# Patient Record
Sex: Male | Born: 1955 | Race: White | Hispanic: No | Marital: Single | Smoking: Former smoker
Health system: Southern US, Community
[De-identification: ages and names within clinical notes are randomized; demographics above are authoritative.]

## PROBLEM LIST (undated history)

## (undated) DIAGNOSIS — I10 Essential (primary) hypertension: Secondary | ICD-10-CM

## (undated) DIAGNOSIS — M1990 Unspecified osteoarthritis, unspecified site: Secondary | ICD-10-CM

## (undated) DIAGNOSIS — M869 Osteomyelitis, unspecified: Secondary | ICD-10-CM

## (undated) DIAGNOSIS — F319 Bipolar disorder, unspecified: Secondary | ICD-10-CM

## (undated) HISTORY — PX: HERNIA REPAIR: SHX51

## (undated) HISTORY — PX: FOOT SURGERY: SHX648

## (undated) HISTORY — PX: HAND SURGERY: SHX662

---

## 2003-11-23 ENCOUNTER — Other Ambulatory Visit: Payer: Self-pay

## 2003-11-24 ENCOUNTER — Other Ambulatory Visit: Payer: Self-pay

## 2004-05-13 ENCOUNTER — Emergency Department (HOSPITAL_COMMUNITY): Admission: EM | Admit: 2004-05-13 | Discharge: 2004-05-13 | Payer: Self-pay | Admitting: Emergency Medicine

## 2004-09-23 ENCOUNTER — Emergency Department: Payer: Self-pay | Admitting: Emergency Medicine

## 2007-01-11 ENCOUNTER — Emergency Department: Payer: Self-pay | Admitting: Emergency Medicine

## 2007-07-23 ENCOUNTER — Ambulatory Visit: Payer: Self-pay | Admitting: Gastroenterology

## 2008-10-28 ENCOUNTER — Emergency Department: Payer: Self-pay | Admitting: Emergency Medicine

## 2008-11-05 ENCOUNTER — Emergency Department: Payer: Self-pay | Admitting: Emergency Medicine

## 2008-12-04 ENCOUNTER — Emergency Department: Payer: Self-pay | Admitting: Emergency Medicine

## 2009-05-10 ENCOUNTER — Emergency Department: Payer: Self-pay | Admitting: Emergency Medicine

## 2009-05-14 ENCOUNTER — Emergency Department: Payer: Self-pay | Admitting: Emergency Medicine

## 2009-10-28 ENCOUNTER — Emergency Department: Payer: Self-pay | Admitting: Emergency Medicine

## 2010-04-02 ENCOUNTER — Ambulatory Visit: Payer: Self-pay | Admitting: Gastroenterology

## 2012-03-20 ENCOUNTER — Emergency Department: Payer: Self-pay | Admitting: Emergency Medicine

## 2012-03-20 LAB — URINALYSIS, COMPLETE
Bilirubin,UR: NEGATIVE
Glucose,UR: NEGATIVE mg/dL (ref 0–75)
Nitrite: NEGATIVE
Ph: 6 (ref 4.5–8.0)
Protein: 30
Specific Gravity: 1.023 (ref 1.003–1.030)

## 2012-03-23 LAB — URINE CULTURE

## 2013-01-26 ENCOUNTER — Emergency Department: Payer: Self-pay | Admitting: Emergency Medicine

## 2013-01-26 LAB — CBC WITH DIFFERENTIAL/PLATELET
Basophil #: 0 10*3/uL (ref 0.0–0.1)
Eosinophil %: 4.5 %
HCT: 43.4 % (ref 40.0–52.0)
Lymphocyte #: 2.6 10*3/uL (ref 1.0–3.6)
Lymphocyte %: 25.6 %
MCH: 28 pg (ref 26.0–34.0)
Monocyte #: 1.2 x10 3/mm — ABNORMAL HIGH (ref 0.2–1.0)
Neutrophil %: 57.4 %
RDW: 13.7 % (ref 11.5–14.5)

## 2013-01-26 LAB — BASIC METABOLIC PANEL
BUN: 15 mg/dL (ref 7–18)
Calcium, Total: 8.9 mg/dL (ref 8.5–10.1)
Co2: 29 mmol/L (ref 21–32)
Creatinine: 1.26 mg/dL (ref 0.60–1.30)
Osmolality: 275 (ref 275–301)
Sodium: 136 mmol/L (ref 136–145)

## 2013-01-26 LAB — URIC ACID: Uric Acid: 7.4 mg/dL — ABNORMAL HIGH (ref 3.5–7.2)

## 2013-01-26 LAB — SEDIMENTATION RATE: Erythrocyte Sed Rate: 1 mm/hr (ref 0–20)

## 2013-01-29 ENCOUNTER — Inpatient Hospital Stay: Payer: Self-pay | Admitting: Internal Medicine

## 2013-01-29 LAB — COMPREHENSIVE METABOLIC PANEL
Albumin: 3.5 g/dL (ref 3.4–5.0)
Alkaline Phosphatase: 138 U/L — ABNORMAL HIGH (ref 50–136)
Calcium, Total: 8.7 mg/dL (ref 8.5–10.1)
Co2: 29 mmol/L (ref 21–32)
EGFR (African American): >60
Glucose: 113 mg/dL — ABNORMAL HIGH (ref 65–99)
SGOT(AST): 60 U/L — ABNORMAL HIGH (ref 15–37)
SGPT (ALT): 72 U/L (ref 12–78)
Sodium: 136 mmol/L (ref 136–145)

## 2013-01-29 LAB — CBC WITH DIFFERENTIAL/PLATELET
Basophil %: 0.8 %
Eosinophil %: 2.4 %
Lymphocyte #: 1.6 10*3/uL (ref 1.0–3.6)
Lymphocyte %: 18.7 %
Neutrophil %: 67.5 %
Platelet: 227 10*3/uL (ref 150–440)
RDW: 13.4 % (ref 11.5–14.5)
WBC: 8.6 10*3/uL (ref 3.8–10.6)

## 2013-01-30 LAB — CBC WITH DIFFERENTIAL/PLATELET
Basophil #: 0 10*3/uL (ref 0.0–0.1)
Basophil %: 0.2 %
HCT: 41.4 % (ref 40.0–52.0)
HGB: 14.4 g/dL (ref 13.0–18.0)
MCH: 28 pg (ref 26.0–34.0)
Neutrophil #: 7 10*3/uL — ABNORMAL HIGH (ref 1.4–6.5)
Neutrophil %: 86.9 %
WBC: 8 10*3/uL (ref 3.8–10.6)

## 2013-01-30 LAB — BASIC METABOLIC PANEL
Anion Gap: 8 (ref 7–16)
BUN: 12 mg/dL (ref 7–18)
Chloride: 103 mmol/L (ref 98–107)
Creatinine: 1.42 mg/dL — ABNORMAL HIGH (ref 0.60–1.30)
Potassium: 4.8 mmol/L (ref 3.5–5.1)

## 2013-01-31 LAB — BASIC METABOLIC PANEL
BUN: 21 mg/dL — ABNORMAL HIGH (ref 7–18)
Chloride: 108 mmol/L — ABNORMAL HIGH (ref 98–107)
Co2: 23 mmol/L (ref 21–32)
Glucose: 136 mg/dL — ABNORMAL HIGH (ref 65–99)
Osmolality: 281 (ref 275–301)
Potassium: 4.1 mmol/L (ref 3.5–5.1)
Sodium: 138 mmol/L (ref 136–145)

## 2013-01-31 LAB — VANCOMYCIN, TROUGH: Vancomycin, Trough: 7 ug/mL — ABNORMAL LOW (ref 10–20)

## 2013-02-01 LAB — PROTEIN / CREATININE RATIO, URINE
Protein, Random Urine: 8 mg/dL (ref 0–12)
Protein/Creat. Ratio: 63 mg/gCREAT (ref 0–200)

## 2013-02-01 LAB — URINALYSIS, COMPLETE
Bacteria: NONE SEEN
Blood: NEGATIVE
Glucose,UR: NEGATIVE mg/dL (ref 0–75)
Leukocyte Esterase: NEGATIVE
Ph: 5 (ref 4.5–8.0)
Protein: NEGATIVE
RBC,UR: NONE SEEN /HPF (ref 0–5)

## 2013-02-01 LAB — BASIC METABOLIC PANEL
BUN: 17 mg/dL (ref 7–18)
Calcium, Total: 9.2 mg/dL (ref 8.5–10.1)
Chloride: 102 mmol/L (ref 98–107)
Co2: 27 mmol/L (ref 21–32)
Creatinine: 1.55 mg/dL — ABNORMAL HIGH (ref 0.60–1.30)
EGFR (Non-African Amer.): 50 — ABNORMAL LOW
Glucose: 88 mg/dL (ref 65–99)
Potassium: 3.7 mmol/L (ref 3.5–5.1)

## 2013-02-02 LAB — CREATININE, SERUM
Creatinine: 1.29 mg/dL (ref 0.60–1.30)
EGFR (African American): >60
EGFR (Non-African Amer.): >60

## 2013-02-08 LAB — URINALYSIS, COMPLETE
Bacteria: NONE SEEN
Ph: 5 (ref 4.5–8.0)
RBC,UR: 1 /HPF (ref 0–5)
WBC UR: 1 /HPF (ref 0–5)

## 2013-02-08 LAB — COMPREHENSIVE METABOLIC PANEL
Albumin: 4.2 g/dL (ref 3.4–5.0)
Albumin: 3.2 g/dL — ABNORMAL LOW (ref 3.4–5.0)
Alkaline Phosphatase: 105 U/L (ref 50–136)
Bilirubin,Total: 0.7 mg/dL (ref 0.2–1.0)
Bilirubin,Total: 1 mg/dL (ref 0.2–1.0)
Calcium, Total: 9.4 mg/dL (ref 8.5–10.1)
Calcium, Total: 7.7 mg/dL — ABNORMAL LOW (ref 8.5–10.1)
Chloride: 108 mmol/L — ABNORMAL HIGH (ref 98–107)
Co2: 26 mmol/L (ref 21–32)
Creatinine: 1.77 mg/dL — ABNORMAL HIGH (ref 0.60–1.30)
EGFR (Non-African Amer.): >60
Glucose: 127 mg/dL — ABNORMAL HIGH (ref 65–99)
Osmolality: 281 (ref 275–301)
Potassium: 3.5 mmol/L (ref 3.5–5.1)
SGOT(AST): 70 U/L — ABNORMAL HIGH (ref 15–37)
SGOT(AST): 50 U/L — ABNORMAL HIGH (ref 15–37)
SGPT (ALT): 46 U/L (ref 12–78)
SGPT (ALT): 46 U/L (ref 12–78)
Sodium: 137 mmol/L (ref 136–145)
Sodium: 141 mmol/L (ref 136–145)
Total Protein: 8.8 g/dL — ABNORMAL HIGH (ref 6.4–8.2)
Total Protein: 6.5 g/dL (ref 6.4–8.2)

## 2013-02-08 LAB — CBC
HCT: 43.8 % (ref 40.0–52.0)
HGB: 14.9 g/dL (ref 13.0–18.0)
HGB: 12.7 g/dL — ABNORMAL LOW (ref 13.0–18.0)
MCHC: 34.8 g/dL (ref 32.0–36.0)
MCV: 83 fL (ref 80–100)
Platelet: 350 10*3/uL (ref 150–440)
Platelet: 260 10*3/uL (ref 150–440)
RDW: 13.4 % (ref 11.5–14.5)
WBC: 12.2 10*3/uL — ABNORMAL HIGH (ref 3.8–10.6)

## 2013-02-08 LAB — CK TOTAL AND CKMB (NOT AT ARMC)
CK, Total: 1688 U/L — ABNORMAL HIGH (ref 35–232)
CK-MB: 25.8 ng/mL — ABNORMAL HIGH (ref 0.5–3.6)

## 2013-02-08 LAB — DRUG SCREEN, URINE
Benzodiazepine, Ur Scrn: NEGATIVE (ref ?–200)
Cannabinoid 50 Ng, Ur ~~LOC~~: NEGATIVE (ref ?–50)
Cocaine Metabolite,Ur ~~LOC~~: NEGATIVE (ref ?–300)
Methadone, Ur Screen: NEGATIVE (ref ?–300)
Phencyclidine (PCP) Ur S: NEGATIVE (ref ?–25)

## 2013-02-08 LAB — ETHANOL
Ethanol %: <0.003 % (ref 0.000–0.080)
Ethanol: <3 mg/dL

## 2013-02-08 LAB — TSH: Thyroid Stimulating Horm: 0.32 u[IU]/mL — ABNORMAL LOW

## 2013-02-09 LAB — BASIC METABOLIC PANEL
Anion Gap: 5 — ABNORMAL LOW (ref 7–16)
BUN: 11 mg/dL (ref 7–18)
Calcium, Total: 8.2 mg/dL — ABNORMAL LOW (ref 8.5–10.1)
Chloride: 108 mmol/L — ABNORMAL HIGH (ref 98–107)
EGFR (African American): >60
EGFR (Non-African Amer.): >60
Glucose: 127 mg/dL — ABNORMAL HIGH (ref 65–99)
Osmolality: 282 (ref 275–301)
Potassium: 3.6 mmol/L (ref 3.5–5.1)

## 2013-02-09 LAB — CK TOTAL AND CKMB (NOT AT ARMC)
CK, Total: 1840 U/L — ABNORMAL HIGH (ref 35–232)
CK-MB: 24.7 ng/mL — ABNORMAL HIGH (ref 0.5–3.6)

## 2013-02-10 ENCOUNTER — Inpatient Hospital Stay: Payer: Self-pay | Admitting: Psychiatry

## 2013-02-10 LAB — BEHAVIORAL MEDICINE 1 PANEL
Albumin: 3 g/dL — ABNORMAL LOW (ref 3.4–5.0)
Alkaline Phosphatase: 109 U/L (ref 50–136)
Anion Gap: 8 (ref 7–16)
BUN: 9 mg/dL (ref 7–18)
Basophil #: 0.1 10*3/uL (ref 0.0–0.1)
Basophil %: 1.2 %
Bilirubin,Total: 0.5 mg/dL (ref 0.2–1.0)
Calcium, Total: 8.6 mg/dL (ref 8.5–10.1)
Chloride: 106 mmol/L (ref 98–107)
Co2: 26 mmol/L (ref 21–32)
Creatinine: 1.13 mg/dL (ref 0.60–1.30)
EGFR (African American): >60
EGFR (Non-African Amer.): >60
Eosinophil #: 0.5 10*3/uL (ref 0.0–0.7)
Eosinophil %: 6.4 %
Glucose: 125 mg/dL — ABNORMAL HIGH (ref 65–99)
HCT: 37.9 % — ABNORMAL LOW (ref 40.0–52.0)
HGB: 13.1 g/dL (ref 13.0–18.0)
Lymphocyte #: 1.9 10*3/uL (ref 1.0–3.6)
Lymphocyte %: 24.7 %
MCH: 28.1 pg (ref 26.0–34.0)
MCHC: 34.5 g/dL (ref 32.0–36.0)
MCV: 82 fL (ref 80–100)
Monocyte #: 0.7 x10 3/mm (ref 0.2–1.0)
Monocyte %: 9.9 %
Neutrophil #: 4.4 10*3/uL (ref 1.4–6.5)
Neutrophil %: 57.8 %
Osmolality: 280 (ref 275–301)
Platelet: 237 10*3/uL (ref 150–440)
Potassium: 3.4 mmol/L — ABNORMAL LOW (ref 3.5–5.1)
RBC: 4.66 10*6/uL (ref 4.40–5.90)
RDW: 13.3 % (ref 11.5–14.5)
SGOT(AST): 77 U/L — ABNORMAL HIGH (ref 15–37)
SGPT (ALT): 50 U/L (ref 12–78)
Sodium: 140 mmol/L (ref 136–145)
Thyroid Stimulating Horm: 0.371 u[IU]/mL — ABNORMAL LOW
Total Protein: 6.4 g/dL (ref 6.4–8.2)
WBC: 7.6 10*3/uL (ref 3.8–10.6)

## 2013-02-10 LAB — CK-MB: CK-MB: 8.2 ng/mL — ABNORMAL HIGH (ref 0.5–3.6)

## 2013-02-10 LAB — CK: CK, Total: 1279 U/L — ABNORMAL HIGH (ref 35–232)

## 2013-02-16 LAB — CK-MB: CK-MB: 1.9 ng/mL (ref 0.5–3.6)

## 2013-02-18 ENCOUNTER — Emergency Department: Payer: Self-pay | Admitting: Emergency Medicine

## 2013-02-26 DIAGNOSIS — F315 Bipolar disorder, current episode depressed, severe, with psychotic features: Secondary | ICD-10-CM | POA: Insufficient documentation

## 2013-03-23 DIAGNOSIS — M109 Gout, unspecified: Secondary | ICD-10-CM | POA: Insufficient documentation

## 2013-04-14 DIAGNOSIS — Z79899 Other long term (current) drug therapy: Secondary | ICD-10-CM | POA: Insufficient documentation

## 2014-10-02 ENCOUNTER — Emergency Department: Payer: Self-pay | Admitting: Student

## 2015-03-03 NOTE — Consult Note (Signed)
Brief Consult Note: Consult note dictated.   Comments: agressive tenosynovitis of left foot 1-3 MTP's and midfoot in absense of skin break, bite ,diabetes or fever. agree with   Dr Orland Jarredroxler, most likely crystalline with prior hx of gout. with renal insufficiency, agree with pred taper and colcrys .6 mg bid.  Electronic Signatures: Royann ShiversKernodle, Jr., Helen HashimotoGeorge Wallace (MD)  (Signed 21-Mar-14 18:10)  Authored: Brief Consult Note   Last Updated: 21-Mar-14 18:10 by Royann ShiversKernodle, Jr., Helen HashimotoGeorge Wallace (MD)

## 2015-03-03 NOTE — Consult Note (Signed)
PATIENT NAME:  Randy Downs, Jermanie A MR#:  [id number] DATE OF BIRTH:  [birth year]  DATE OF CONSULTATION:  02/09/2013  REFERRING PHYSICIAN:  Dr. Governor Rooksebecca Lord  CONSULTING PHYSICIAN:  Hope PigeonVaibhavkumar G. Elisabeth PigeonVachhani, MD  REASON FOR MEDICAL CONSULT: Elevated CK level.   HISTORY OF PRESENT ILLNESS: This is a 59 year old male with known history  bipolar disorder, hypertension and left fourth of osteomyelitis, chronic. Was recently admitted to hospital last week for his gout flare up so and was discharged home after successfully being treated. He was also found having some renal failure while in the hospital, but then was getting corrected. He went home 5 days ago and called EMS yesterday saying that he wants to kill  himself and hard himself and so he called EMS by himself and came over here. He is in ER, since yesterday and is being getting treatment by psych and ER in combination. His CK level was elevated 168 on arrival to ER and ER gave 4 liters of IV fluids total in 24 hours, but  CK level is not getting corrected. On further questioning, the patient denies any new pain or swelling in any joints. He said that yesterday in the ER last night he fell down and he injured his left side of the body. He has some pain in his thigh, but he is able move it  REVIEW OF SYSTEMS:   CONSTITUTIONAL:  Negative for fever, fatigue, weakness, pain or weight loss.  EYES: No blurring, double vision, pain or edema.  EARS, NOSE, THROAT: No tinnitus, ear pain or hearing loss.  RESPIRATORY: No cough, wheezing or dyspnea.  CARDIOVASCULAR: No chest pain, orthopnea, arrhythmia, palpitations.  GASTROINTESTINAL: No nausea, vomiting, diarrhea or abdominal pain.  GENITOURINARY: No increased frequency of the urination.  ENDOCRINOLOGY: No heat or cold intolerance.  SKIN: No acne or rashes.  MUSCULOSKELETAL: Some pain on the left side of the thigh, but no swelling or tenderness.  NEUROLOGICAL: No numbness, weakness, dysarthria or tremors.    PAST MEDICAL HISTORY: Hypertension, bipolar disorder, schizoaffective disorder and gout, history of chronic left foot osteomyelitis.   ALLERGIES: HALDOL.   FAMILY HISTORY: Mother has diabetes.   SOCIAL HISTORY: No smoking. No alcohol. He works part-time Orthoptistindustry making fishing lures.    MEDICATIONS: Unable to confirm as the patient is not very sure about what medicines he was taking and what not. As per the discharge instructions from the last week, he was advised  to take Abilify 2.5 mg orally once a day, Percocet every 4 to 6 hours as needed for pain and Ambien 10 mg oral tablet once a day. But pharmacy technician tried to confirm it with the pharmacy, they are not able to confirm patient and is having acute psychiatric illness going on, so he is not reliable. As he says that he does not remember if he took or not.  He tried to take it all that is what he says.    PHYSICAL EXAMINATION: VITAL SIGNS:  Temperature 97.4, pulse rate 75, respirations 18, blood pressure 119/67 and pulse oximetry 97 on room air.  GENERAL: He is alert and appears oriented, talked to me nicely, but having some psychiatric issue, because he appears to be all concerned about issues sometimes while talking to me.   HEENT: Head and neck atraumatic. Conjunctivae pink. Oral mucosa moist.  NECK: Supple. No JVD.  RESPIRATORY: Bilateral clear and equal air entry.  CARDIOVASCULAR: S1, S2 present. Regular. No murmur.  ABDOMEN: Soft, nontender, bowel sounds present. No organomegaly.  SKIN: No rashes.  JOINTS: Left second toe with some swelling. No redness. Mild tenderness present, otherwise, all other joints normal, nontender.  SKIN: No rashes.  LEGS: No edema.  NEUROLOGICAL: Power 5/5 in all 4 limbs. No tremors.   LABORATORY, DIAGNOSTIC AND RADIOLOGIC DATA:  Glucose 127, BUN 11, creatinine 1.01, sodium 141, potassium 3.6, chloride 108, CO2 28, calcium 8.2, magnesium 1.9, total protein 6.7, albumin 3.2, bilirubin 1.3,  alkaline phosphate 102, SGOT 75, SGPT 46, CK level 1688 on presentation, went up to 1840 and then came down to 1715. Troponin less than 0.02. WBC 9.9, hemoglobin 12.7 and platelet count 260.   Chest x-ray, portable single view: No acute cardiopulmonary disease.   CT head: No evidence of acute intracranial abnormality.   ASSESSMENT AND PLAN: A 59 year old male with multiple psychiatric problems, hypertension, chronic gout and chronic osteomyelitis, presented to the ER for psychiatric issues. Medical consult for elevated CK level.  1.  Elevated CK. The patient already had issues like gout and chronic osteomyelitis and he had mild fall in the ER without any major injuries. CK level did not come down after giving him IV fluids for 5 to 6 liters.  This seems to be chronic and not a major issue.  We will keep giving IV fluids and follow CK level and renal function daily. No further work-up at this time. If he does not improve and have complaint of more pain or swelling then he might need hematologic consult.  2.  Hypertension. Continue his medication metoprolol.  3.  Bipolar or schizoaffective disorder. As per the psych, admit to psych inpatient as they are planning   CODE STATUS:  Full code.   TOTAL TIME SPENT ON THIS CONSULT: 50 minutes.      ____________________________ Hope Pigeon Elisabeth Pigeon, MD vgv:cc D: 02/09/2013 16:04:54 ET T: 02/09/2013 16:24:57 ET JOB#: [id number]  cc: Hope Pigeon. Elisabeth Pigeon, MD, <Dictator> Altamese Dilling MD ELECTRONICALLY SIGNED 02/21/2013 22:18

## 2015-03-03 NOTE — Consult Note (Signed)
PATIENT NAME:  Randy Downs, Parth A MR#:  [id number] DATE OF BIRTH:  [birth year]  DATE OF CONSULTATION:  01/31/2013  REFERRING PHYSICIAN:  Dr. Shaune PollackQing Chen. CONSULTING PHYSICIAN:  Marton Malizia Lizabeth LeydenN. Summerlynn Glauser, MD  REASON FOR CONSULTATION: Acute renal failure.   HISTORY OF PRESENT ILLNESS: The patient is a very pleasant 59 year old Caucasian male with past medical history of hypertension, bipolar disorder, schizoaffective disorder, history of gout in the past, who presented to [facility] with complaints of left foot pain. He reports that he was having pain in the second toe on the left. He also had some redness that was extending below the toes laterally. He has been seen by Dr. Orland Jarredroxler and Dr. Gavin PottersKernodle. He was felt to have tenosynovitis that was most likely secondary to acute gout attack. We are now consulted for evaluation and management of acute renal failure. The patient's baseline creatinine was 1.26 from 01/26/2013. Creatinine now is 1.56. The patient is noted to be on indomethacin, which can certainly be a nephrotoxin. The patient is also taking colchicine. The patient is currently on IV fluid hydration with normal saline at 125 mL per hour and renal ultrasound is also ordered. The patient has not had any sustained hypotension during this admission.   PAST MEDICAL HISTORY:  1.  Hypertension.  2.  Bipolar disorder.  3.  Schizoaffective disorder.  4.  History of gout in the past.   ALLERGIES: HALDOL.   CURRENT INPATIENT MEDICATIONS: Include 0.9 normal saline at 125 mL per hour, Tylenol 650 mg p.o. q. 4 hours p.r.n., Norco 5/325 mg 1 to 2 tablets q. 4 to 6 hours p.r.n. pain, Abilify 2.5 mg p.o. daily, Colace 100 mg p.o. b.i.d., metoprolol 25 mg p.o. daily, Zofran 4 mg IV q. 4 hours p.r.n., senna 1 tablet p.o. b.i.d., Ambien 10 mg p.o. at bedtime, heparin 5000 units subcutaneous q. 12 hours and prednisone taper.   SOCIAL HISTORY: [place]. He is married. He denies  tobacco, alcohol, or illicit drug use. He works part time Doctor, hospitalmaking fishing rods.   FAMILY HISTORY: The patient's mother has Parkinson's, bipolar disorder and diabetes. The patient's father is also alive and does not have any apparent medical issues.   REVIEW OF SYSTEMS:   CONSTITUTIONAL: Denies fevers, chills, weight loss.  EYES: Denies diplopia, blurry vision.  HEENT: Denies headaches, hearing loss. Denies epistaxis.  CARDIOVASCULAR: Denies chest pain, palpitations, PND.  RESPIRATORY: Denies cough, shortness of breath, or hemoptysis.  GASTROINTESTINAL: Denies nausea, vomiting, dysphagia.  GU: Denies frequency, urgency, dysuria or hematuria.  MUSCULOSKELETAL: Denies joint pain, swelling or redness.  INTEGUMENTARY: Denies skin rashes or lesions.  NEUROLOGIC: Denies focal extremity numbness, weakness or tingling.  PSYCHIATRIC: Has history of known bipolar disorder and schizoaffective disorder.  ENDOCRINE: Denies polyuria, polydipsia or polyphagia.  HEMATOLOGIC/LYMPHATIC: Denies easy bruisability, bleeding or swollen lymph nodes. ALLERGY/IMMUNOLOGIC: Denies seasonal allergies or history of immunodeficiency.   PHYSICAL EXAMINATION:    VITAL SIGNS: Temperature 97.6, pulse 72, respirations 20, blood pressure 130/89, pulse oximetry 97%.  GENERAL: Well-developed, well-nourished, Caucasian male who appears his stated age, currently in no acute distress.  HEENT: Normocephalic, atraumatic. Extraocular movements are intact. Pupils equal, round, and reactive to light. No scleral icterus. Conjunctivae are pink. No epistaxis noted. Gross hearing intact. Oral mucosa moist.  NECK: Supple without JVD or lymphadenopathy.  LUNGS: Clear to auscultation bilaterally with normal respiratory effort.  HEART: S1, S2 regular rate and rhythm. No murmurs, rubs, or gallops appreciated.  ABDOMEN: Soft, nontender, nondistended.  Bowel sounds positive. No rebound or guarding. No gross organomegaly appreciated.  EXTREMITIES:  No clubbing or cyanosis. Trace bilateral lower extremity edema noted. In regards to the left foot, there is some erythema at the base of the left second toe. There is also some redness extending below the toes laterally. There is some mild tenderness to touch overlying this area.  NEUROLOGIC: The patient is alert and oriented to time, person, and place. Strength is 5 out of 5 in both upper and lower extremities. Gait was observed and was normal.  MUSCULOSKELETAL: Left foot as described above.  SKIN: Warm and dry. No rashes noted.  GENITOURINARY: No suprapubic tenderness noted.  PSYCHIATRIC: The patient with a flat affect, but does appear to have good insight into his current illness.   LABORATORY DATA: Sodium 138, potassium 4.1, chloride 108, CO2 23, BUN 21, creatinine 1.56, glucose 136, total protein 7.7, albumin 3.5, total bilirubin 1.0, alkaline phosphatase 138, AST 30, ALT 72. CBC shows WBC 8, hemoglobin 14, hematocrit 41, platelets 269. Blood cultures from 01/26/2013 showed no growth.   IMPRESSION: This is a 59 year old Caucasian male with past medical history of hypertension, bipolar disorder, schizoaffective disorder, prior history of left foot osteomyelitis, who presented to [facility] with left foot pain and thought to have acute gouty attack.   PROBLEM LIST:  1.  Acute renal failure.  2.  Gout versus left foot cellulitis.  3.  Hypertension.   PLAN: The patient presents with an interesting case. He has had left foot pain for the past several weeks. Initially it was thought that he may have had cellulitis and was treated with Bactrim at [facility]. It appears that he has also been on cephalexin recently. In addition, the patient, over the past several days, had been on indomethacin; however, this has now been held. Second-generation cephalosporins at times are associated with allergic interstitial nephritis as well as Bactrim. In addition, the patient's creatinine  has been worsening over the past 3 days and this may be related to indomethacin. I agree with holding indomethacin as well as colchicine at this time. Continue IV fluid hydration with 0.9 normal saline. I agree with obtaining renal ultrasound as well. Would avoid any further nephrotoxin administration and would also avoid IV contrast at this time. We will also obtain a urinalysis and urine protein to creatinine ratio. We will continue to follow creatinine trend over the course of the hospitalization.   I would like to thank Dr. Imogene Burn for this kind referral. Further plans as the patient progresses.     ____________________________ Lennox Pippins, MD mnl:aw D: 01/31/2013 12:41:36 ET T: 01/31/2013 13:34:30 ET JOB#: [id number]  cc: Lennox Pippins, MD, <Dictator> Lennox Pippins MD ELECTRONICALLY SIGNED 02/12/2013 11:33
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
## 2015-03-03 NOTE — Consult Note (Signed)
PATIENT NAME:  Randy Downs, Randy Downs MR#:  [id number] DATE OF BIRTH:  01/05/1956  DATE OF CONSULTATION:    REFERRING PHYSICIAN:   CONSULTING PHYSICIAN:  Rhona Raider. Jeaneen Cala, DPM  REASON FOR CONSULTATION AND HISTORY OF PRESENT ILLNESS:  I was asked to consult for cellulitis, redness to his left foot.  Had an x-ray done earlier today which was suspicious for potential osteomyelitis and I was consulted.    His medical history includes, regarding the foot in particular, involves a puncture wound of that left foot 25 years ago where they had to go in and clean out the wound and scrape the bone and felt like he may had some osteomyelitis.  He was on IV antibiotics or 5 weeks at that time timeframe.  It has not bothered him since that 25 years ago as far as a recurrence of the osteomyelitis.  He has had pain in the second metatarsophalangeal joint off and on since that timeframe, has some osteoarthritis, but has not had a real flare of any type of osteomyelitis for 25 years.  Has had a history of gout attack in his right first MTP joint in the past.    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   ALLERGIES:  He is allergic to HALDOL.   MEDICATIONS:  Currently takes Abilify.    He presents today with a significant redness to his left foot, swelling, pain and discomfort.   LABORATORY DATA:  Lab work from the ER today, he has a white count of 8.6, hemoglobin and hematocrit are within normal limits.  Does have glucose that is slightly elevated at 113, but that was nonfasting, creatinine slightly up at 1.37, BUN is normal at 12, alkaline phosphatase slightly elevated at 138.  His erythrocyte sedimentation rate is 16, within normal limits.  As noted, his white count was normal also.  Had a serum uric acid done on March 18th which was elevated at 7.4, but he also was having a  significant acute attack in his left foot at that timeframe.  He has been on a couple different bouts of antibiotics for this and has not improved any.  Erythrocyte sedimentation rate is up slightly at this time versus what it was on March 18th.  March 18th it was 1, now it is 69.   PHYSICAL EXAMINATION:  VITAL SIGNS:  Temperature is 98.5, pulse 92, respirations 18, blood pressure 150/91, pulse ox 95.  LOWER EXTREMITIES:  Vascular DP pulses are +2 over 4 bilaterally.  DERMATOLOGIC:  The patient has significant cellulitis starting at the base of the second toe extending dorsally with redness, swelling to the foot in general dorsally and swelling to the second toe considerably.  There is some very acute redness to the region.  There is no open wound or puncture wound penetration.  There is no (Dictation Anomaly) <sinus <MISSING TEXT>> tracts.  No evidence of abscess at this timeframe.  He denies any recent history of infection anywhere else at this juncture.   IMAGING STUDIES:  X-rays reviewed.  There is degenerative change at the second metatarsal head and base of the proximal phalanx.  This looks to be chronic in nature to me.  Does not appear to be infectious at this juncture or acute infectious at this juncture.  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  Especially in light of no open source of infection or open wound type situation.   CLINICAL IMPRESSION:  My gut feeling is this is likely a very acute gout response in his left foot, second metatarsophalangeal joint.  He states maybe there is 3 reasons why this is paramount as far as possibilities.   1.  He has had a gout attack before in his right foot, but has never taken any medication for it.  He has tried to manage it with his diet.  2.  He has already some degenerative changes in that second metatarsophalangeal joint based on the x-rays that  I saw.  I think these are chronic in nature.  3.  He bumped the toe about a week ago prior to this thing getting red, swollen and irritated which sometimes can precipitate inflammation, increased concentration of uric acid in the joint.  Also, I do not think we can rule out yet infectious process so we should cover him with antibiotics as well.   TREATMENT PLAN:  Cover him with IV antibiotics and I would recommend starting some IV prednisone to see if he does not get better rapidly with this once things are calmed down.  We should repeat the uric acid and see if it is elevated and maybe get him on some type of medicine for chronic reduction of his uric acid.  I will follow him tomorrow to see if he has done better from an overall standpoint with the left foot.       ____________________________ Rhona RaiderMatthew G. Carlas Vandyne, DPM mgt:ea D: 01/29/2013 16:32:37 ET T: 01/29/2013 23:23:04 ET JOB#: [id number]  cc: Rhona RaiderMatthew G. Lenora Gomes, DPM, <Dictator> Randy SarinMATTHEW G Mostafa Yuan MD ELECTRONICALLY SIGNED 02/09/2013 17:38

## 2015-03-03 NOTE — Discharge Summary (Signed)
PATIENT NAME:  Randy Downs, Randy Downs MR#:  [id number] DATE OF BIRTH:  Jul 01, 1956  DATE OF ADMISSION:  01/29/2013 DATE OF DISCHARGE:  02/02/2013  DISCHARGE DIAGNOSES: 1. Acute gouty arthropathy with gout flare and possible underlying chronic osteomyelitis responding to prednisone taper.  2.  Acute renal failure, now resolved, likely due to Colcrys, indomethacin and vancomycin.   SECONDARY DIAGNOSES: 1.  Hypertension.  2.  Bipolar disorder.  3.  Schizoaffective disorder.  4.  History of left foot osteomyelitis.   CONSULTANTS: 1.  Recardo EvangelistMatthew Troxler, MD - Podiatry.  2.  Andrez GrimeG. W. Kernodle, MD - Rheumatology. 3.  Mady HaagensenMunsoor Lateef, MD - Nephrology.  PROCEDURES/RADIOLOGY: Bilateral kidney ultrasound on 23rd of March showed no hydronephrosis, left renal cyst, possible medical renal disease.   MAJOR LABORATORY PANEL: UA on the 24th of March was negative.   HISTORY AND SHORT HOSPITAL COURSE: The patient is Downs Randy Downs with above-mentioned medical problems who was admitted for possible left foot osteomyelitis. Was started on IV antibiotic. Podiatry was consulted. Please see Dr. Margaretmary EddyShah's dictated history and physical for further details. Podiatry felt this to be more of gout flare for which rheumatology, Dr. Gavin PottersKernodle, was consulted who recommended starting him on colchicine, indomethacin and prednisone taper. The patient was started on those and was improving. He was also continued on antibiotic as there was felt to be some component of osteomyelitis. The patient started having significant improvement while on Colcrys, indomethacin and prednisone, but his kidney function started getting worse for which nephrology consultation was obtained with Dr. Cherylann RatelLateef.  All 3 medications, including vancomycin, colchicine and indomethacin were stopped. The patient was slowly improving with his kidney function normalized on the 25th of March.  It peaked up to 1.55, which was concerning due to the patient being on above  medication. After discussion with consultants, the patient was discharged home on the 25th of March as he had had significant improvement and he was able to walk without much difficulty. His leg/foot had huge improvement. On the date of discharge, his vital signs are as follows: Temperature 97.5, heart rate 70 per minute, respirations 14 per minute, blood pressure 158/102 mmHg and he was saturating 96% on room air.   PERTINENT PHYSICAL EXAMINATION: On the date of discharge: CARDIOVASCULAR: S1, S2 normal. No murmurs, rubs or gallops.  LUNGS: Clear to auscultation bilaterally. No wheezing, rales, rhonchi or crepitation.  ABDOMEN: Soft, benign.  NEUROLOGIC: Nonfocal examination.  LEFT FOOT:  Seemed to have no swelling, no redness. On the date of discharge, there is no open wound or puncture wound around the area either. There was no evidence of abscess. All other physical examination remained at baseline.   DISCHARGE MEDICATIONS: 1.  Abilify 2.5 mg p.o. daily.  2.  Percocet 5/325 mg 1 capsule p.o. every 4 to 6 hours as needed.  3.  Ambien 10 mg p.o. at bedtime.   DISCHARGE DIET: Low sodium.   DISCHARGE ACTIVITY: As tolerated.   DISCHARGE INSTRUCTIONS AND FOLLOWUP: With his primary care physician, Dr. Darreld McleanLinda Downs, in 1 to 2 weeks. He will need followup with rheumatology, Dr. Saverio DankerWallace Downs, in 2 to 4 weeks and Dr. Orland Downs from podiatry in 4 to 6 weeks.   TOTAL TIME DISCHARGING THIS PATIENT: 45 minutes.  ____________________________ Ellamae SiaVipul S. Sherryll BurgerShah, MD vss:sb D: 02/03/2013 12:55:35 ET     T: 02/03/2013 13:08:28 ET        JOB#: [id number] cc: Diana Davenport S. Sherryll BurgerShah, MD, <Dictator> Leanna SatoLinda M. Miles, MD Kandyce RudGeorge W. Downs Jr., MD  Rhona Raider Troxler, DPM Munsoor Lizabeth Leyden, MD Patricia Pesa MD ELECTRONICALLY SIGNED 02/04/2013 20:39

## 2015-03-03 NOTE — H&P (Signed)
DATE OF BIRTH:  [birth year]  DATE OF ADMISSION:  01/29/2013  PRIMARY CARE PHYSICIAN:  Dr. Darreld Mclean  REQUESTING PHYSICIAN:  Dr. Darreld Mclean  CHIEF COMPLAINT:  Left foot pain.   HISTORY OF PRESENT ILLNESS: The patient is a 59 year old male with a known history of schizoaffective disorder, bipolar disorder, hypertension. He is being admitted for possible left foot osteomyelitis. The patient stepped on the bathroom scale about 8 to 10 days ago. Since then, he started having severe pain in the leg, and redness. He came to [facility] ED on March 18, about 3 days ago, when x-ray was done, and he was prescribed Bactrim and some pain medicine. On March 19, he went to [facility], as he did not feel getting any better, where he was given erythromycin and Percocet, and was discharged home. He went back again to the Emergency Department yesterday at [facility], and was given another dose of IV erythromycin, and was discharged. The patient continued to have painful toe along with worsening redness, and he called his primary care physician, who requested him to get admitted as a direct admit. The patient is having 6 out of 10 pain. Also reports chills. No fever. He is unable to put any weight on the left foot, and has been using a cane since the 18th of March, when he was here in the Emergency Department. The patient reports having a similar problem about 25 years ago, when he stepped on a nail on the same foot, and was diagnosed with osteomyelitis and had a debridement and scraping of the bone at that time, and had a long hospital stay with IV antibiotics.   The patient also reports having constipation. Last bowel movement was about 3 days ago. He denies any discharge coming out of the area, but he does have trouble moving his stools. They are quite painful.   PAST MEDICAL HISTORY: 1.  Hypertension.  2.  Bipolar disorder.  3.  Schizoaffective disorder. 4.  History of left foot osteomyelitis.   PAST SURGICAL HISTORY:   Bones scraping and debridement done about 25 years ago.   ALLERGIES:  HALDOL.   FAMILY HISTORY:  Mother had diabetes.   SOCIAL HISTORY: No smoking. No alcohol. He works part-time in an [employer].    MEDICATIONS AT HOME: 1.  Percocet 5/325, 1 tablet p.o. every 4 to 6 hours as needed.  2.  Keflex 500 mg p.o. 4 times a day for 7 days, which was prescribed at [facility].  3.  Bactrim 2 tablets Double Strength twice a day for 7 days, was prescribed by [place] [facility]  for 7 days.  4.  Ambien 10 mg p.o. at bedtime.  5.  Abilify 2.5 mg p.o. daily.   REVIEW OF SYSTEMS:  CONSTITUTIONAL:  No fever, fatigue, weakness.  EYES:  No blurred or double vision.  EARS, NOSE, THROAT:  No tinnitus, ear pain.  RESPIRATORY:  No cough, wheezing, hemoptysis.  CARDIOVASCULAR:  No chest pain, orthopnea, edema.  GASTROINTESTINAL:  No nausea, vomiting, diarrhea. Positive for constipation.  GENITOURINARY:  No dysuria or hematuria.  ENDOCRINE:  No polyuria or nocturia.  HEMATOLOGY: (Dictation Anomaly) present. SKIN:  He has erythema on his left middle 3 toes of the foot. Tenderness around the toes. No obvious open wound or discharge noted.  NEUROLOGIC:  Cranial nerves II to XII intact.  No tingling, numbness, weakness.  PSYCHIATRIC:  No history of anxiety or depression. He does have a history of bipolar disorder and schizoaffective disorder.  PHYSICAL EXAMINATION: VITAL SIGNS:  Temperature 98.5, heart rate 92 per minute, respirations 18 per minute, blood pressure 150/91 mmHg, he is saturating 95% on room air.  GENERAL:  The patient is a 59 year old male lying in the bed comfortably without any acute distress.  EYES:  Pupils equal, round, reactive to light and accommodation. No scleral icterus. Extraocular muscles intact.  HEENT:  Atraumatic, normocephalic. Oropharynx and nasopharynx clear.  NECK:  Supple. No jugular venous distention. No thyromegaly or tenderness.  LUNGS:  Clear to auscultation  bilaterally. No wheezing, rales, rhonchi or crepitation.  CARDIOVASCULAR:  S1, S2 normal. No murmur, rub or gallop.  ABDOMEN:  Soft, nontender, nondistended. Bowel sounds present. No organomegaly or mass.  EXTREMITIES: He has tenderness and erythema on the left foot middle 3 toe area, and it has been demarcated with a black marker. He does have significant tenderness on minimal movement of any of those toes. No cyanosis or clubbing.  NEUROLOGIC:  Cranial nerves II through XII intact. Muscle strength 5/5 in all extremities. Sensation intact.  PSYCHIATRIC:  The patient is oriented to time, place and person x 3.  SKIN:  As mentioned above.   LABORATORY PANEL:  Normal CBC. Blood culture was negative x 1 on 18th of March.  BMP was within normal limits, except creatinine of 1.37. LFTs showed elevated alkaline phosphatase with a value of 138. Total bilirubin 1.1 and AST of 60.   Left foot x-ray on 18th of March showed DJD involving the second metatarsophalangeal joint, possible inflammation and active osteomyelitis cannot be ruled out.   IMPRESSION AND PLAN: 1.  Possible left foot osteomyelitis. He has already received 2 doses of [facility] on 2 different visits, on the 19th and 20th. He also received Bactrim and possibly Keflex as an outpatient from the Emergency Department. For now, will start him on IV vancomycin and Levaquin to cover both gram positives and negatives along with MRSA. Will consult Infectious Disease and Podiatry. I have discussed the case with Dr. Orland Jarredroxler. His blood culture was already negative 3 days ago. Will hold on repeating same. Will hold off wound culture, as this does not have any obvious open wound. He may require debridement and wound scraping again, so will leave that up to Podiatry, and if he does get debridement, a wound culture would be ideal to perform at that time. Will provide Norco for pain management.  2.   Acute renal failure, likely  prerenal in nature. Will hydrate him with IV fluids and avoid any nephrotoxin. Likely prerenal in nature.   3.  Hypertension. Will start him on metoprolol and adjust medication as needed.   4.  Bipolar disorder/schizoaffective disorder. Will continue Abilify and Ambien as per home dose.   5.  Code status:  FULL CODE.   Total time taking care of this patient is 55 minutes.   ____________________________ Ellamae SiaVipul S. Sherryll BurgerShah, MD vss:mr D: 01/29/2013 16:03:29 ET T: 01/29/2013 18:50:42 ET JOB#: [id number]  cc: Josejulian Tarango S. Sherryll BurgerShah, MD, <Dictator> Leanna SatoLinda M. Miles, MD Rhona RaiderMatthew G. Troxler, DPM Rosalyn GessMichael E. Blocker, MD     Patricia PesaVIPUL S Norie Latendresse MD ELECTRONICALLY SIGNED 01/31/2013 16:22

## 2015-03-03 NOTE — H&P (Signed)
PATIENT NAME:  Randy Downs, Randy Downs MR#:  [id number] DATE OF BIRTH:  1956-01-22  DATE OF ADMISSION:  02/10/2013  REFERRING PHYSICIAN: Emergency Room physician.   ATTENDING PHYSICIAN: Orson Slick, M.D.   IDENTIFYING DATA: The patient is a 59 year old male with history of bipolar illness.   CHIEF COMPLAINT: "I need to call Dr. Lennox Grumbles."  HISTORY OF PRESENT ILLNESS: The patient reports that he has been stable on medications prescribed by his primary psychiatrist, Dr. Delight Stare in [place]. He reportedly takes 2.5 mg of Abilify daily. He states that for the past 3 years, there were no hospitalizations or any problems whatsoever. A week or so ago, he was admitted to [facility] with foot pain and was treated for gout. As a result of treatment with prednisone, he developed a manic episode. Following discharge, the patient did not show up in church, which made his pastor worry. When he visited the patient at home, he found him agitated, spitting his medications, hyperactive, insomniac. He called the police and brought the patient to the Emergency Room. In the Emergency Room initially, the patient was extremely agitated and was given multiple medications, which put him to sleep for almost 2 days and it was impossible to interview the patient. There was a suspicion of overdose initially that I believe was not confirmed. When I met the patient in the Emergency Room, he was alert, very concerned that his burgundy scrubs pants were ripped and that he would expose himself indecently and demanded a pair of new pants. He was pleased to see me, very pleasant and polite. He insisted that when he need to transfer him to [facility] and felt that Dr. Delight Stare, his primary psychiatrist, has advised him to do so. He is extremely worried that we will "mess up his medications." He feels that he needs to be restarted on 2.5 mg of Abilify and would be fine. He is willing to come to the hospital to  take a break. He is telling me that for the past few days, he was extremely busy remodeling his house but did not do a good job. He reports severe insomnia, racing thoughts and confusion at times. He was found paranoid and delusional on initial interview. He also believed that he was fishing at the [facility] while talking to Dr. Gretel Acre, our consultant. The patient denies symptoms of depression. He denies alcohol or illicit substance use.   PAST PSYCHIATRIC HISTORY: The patient has a long history of mental illness. There are multiple hospitalizations, at least 4 at [facility] in recent history. He has been tried on numerous medications including lithium, Depakote and Risperdal. He dislikes Depakote, as it causes weight gain. He is willing to take Abilify but no other medications. He reports that he slept well with Ambien the night before. He denies suicide attempts.   FAMILY PSYCHIATRIC HISTORY: His mother suffers mental illness; diagnosis is unknown.   PAST MEDICAL HISTORY: Gout.   ALLERGIES: HALDOL.   MEDICATIONS ON ADMISSION: Percocet 5/325 every 4 to 6 hours for pain, Abilify 2.5 mg daily, Ambien 10 mg at bedtime.   SOCIAL HISTORY: The patient has a 12th grade education. He used to work in [employer]. He is on disability now. He is divorced, has adult children. It is unclear what is his living situation. He tells me that he owns a house in [place].  REVIEW OF SYSTEMS:    CONSTITUTIONAL: No fevers or chills. No weight changes.  EYES: No  double or blurred vision.  ENT: No hearing loss.  RESPIRATORY: No shortness of breath or cough.  CARDIOVASCULAR: No chest pain or orthopnea.  GASTROINTESTINAL: No abdominal pain, nausea, vomiting or diarrhea.  GENITOURINARY: Positive for incontinence.  ENDOCRINE: No heat or cold intolerance.  LYMPHATIC: No anemia or easy bruising.  INTEGUMENTARY: No acne or rash.  MUSCULOSKELETAL: Positive for gout.  NEUROLOGIC: No tingling or  weakness.  PSYCHIATRIC: See history of present illness for details.   PHYSICAL EXAMINATION:  VITAL SIGNS: Blood pressure 154/92, pulse 90, respirations 20, temperature 97.6.  GENERAL: This is a well-developed male in no acute distress.  HEENT: The pupils are equal, round and reactive to light. Sclerae are anicteric.  NECK: Supple. No thyromegaly.  LUNGS: Clear to auscultation. No dullness to percussion.  HEART: Regular rhythm and rate. No murmurs, rubs or gallops.  ABDOMEN: Soft, nontender, nondistended. Positive bowel sounds.  MUSCULOSKELETAL: Normal muscle strength in all extremities.  SKIN: No rashes or bruises.  LYMPHATIC: No cervical adenopathy.  NEUROLOGIC: Cranial nerves II through XII are intact.   LABORATORY AND DIAGNOSTIC DATA: Chemistries are within normal limits except for blood glucose of 125 and potassium 3.4. LFTs within normal limits except for AST of 77. CK almost 2000 on admission, 1200 on recheck. TSH 0.371. Urine tox screen negative for substances. CBC within normal limits. Urinalysis is not suggestive of urinary tract infection. EKG: Normal sinus rhythm, normal EKG.   MENTAL STATUS EXAMINATION: On admission, the patient is examined in the Emergency Room. He is alert and oriented to person and place, somewhat to situation. He is pleasant, polite and cooperative. He is rather talkative, slightly giddy, dramatic. He is wearing hospital scrubs and a yellow shirt. He maintains good eye contact. His speech is fast and louder than normal. Mood is fine with expansive affect. Thought processing: There are racing thoughts and tangential thinking. He denies thoughts of hurting himself or others. He is delusional and paranoid. He denies auditory or visual hallucinations. His cognition is grossly intact but difficult to assess. His insight and judgment are questionable.   SUICIDE RISK ASSESSMENT: This is a patient with a lifelong history of bipolar illness and treatment noncompliance who  developed symptoms of mania in the context of prednisone treatment most likely, as a result of treatment noncompliance with mood stabilizer.   DIAGNOSES:  AXIS I: Bipolar affective disorder, most recent episode mania with psychosis.  AXIS II: Deferred.  AXIS III: Urinary tract infection, recent kidney failure.  AXIS IV: Mental illness, primary support, treatment compliance.  AXIS V: Global assessment of functioning on admission: 25.   PLAN: The patient was admitted to [facility] for safety, stabilization and medication management. He was initially placed on suicide precautions and was closely monitored for any unsafe behaviors. He underwent full psychiatric and risk assessment. He received pharmacotherapy, individual and group psychotherapy, substance abuse counseling and support from therapeutic milieu.  1.  Agitation: This has resolved. The patient does not display any unusual behaviors. 2.  Mood: Psychosis. He insists on taking Abilify. We will increase the dose to 10 mg daily. We will offer a sleeping aid and maybe benzodiazepines as additional mood stabilizers while in the hospital.  3.  Gout: He was consulted by orthopedist in the Emergency Room. No osteomyelitis was found. No treatment suggestions given.  4.  Disposition: He will be discharged likely to home if there is one. He will follow up with Dr. Lennox Grumbles if he wishes to maintain  this relationship.    ____________________________ Wardell Honour. Bary Leriche, MD jbp:jm D: 02/10/2013 18:48:14 ET T: 02/10/2013 19:19:56 ET JOB#: [id number]  cc: Jolanta B. Bary Leriche, MD, <Dictator> Clovis Fredrickson MD ELECTRONICALLY SIGNED 02/22/2013 21:39

## 2015-03-03 NOTE — Consult Note (Signed)
S Cr back to baselinesign offcall if further assistance is needed   Electronic Signatures: Mosetta PigeonSingh, Jackey Housey (MD)  (Signed on 25-Mar-14 08:18)  Authored  Last Updated: 25-Mar-14 08:18 by Mosetta PigeonSingh, Breydan Shillingburg (MD)

## 2015-03-03 NOTE — Consult Note (Signed)
PATIENT NAME:  Randy Downs, [name] MR#:  [id number] DATE OF BIRTH:  [birth year]  DATE OF CONSULTATION:  02/09/2013  REFERRING PHYSICIAN:   CONSULTING PHYSICIAN:  Hope PigeonVaibhavkumar G. Elisabeth PigeonVachhani, MD   NO DICTATION    ____________________________ Hope PigeonVaibhavkumar G. Elisabeth PigeonVachhani, MD vgv:cc D: 02/09/2013 16:05:04 ET T: 02/09/2013 16:23:44 ET JOB#: [id number]  cc: Hope PigeonVaibhavkumar G. Elisabeth PigeonVachhani, MD, <Dictator> Altamese DillingVAIBHAVKUMAR Alene Bergerson MD ELECTRONICALLY SIGNED 02/21/2013 22:18

## 2015-03-03 NOTE — Consult Note (Signed)
Brief Consult Note: Diagnosis: probable acute gout attack left 2nd mtpj.   Patient was seen by consultant.   Consult note dictated.   Recommend further assessment or treatment.   Discussed with Attending MD.   Comments: Pt has what appears to be aseptic necrosis to 2nd mtpj left foot.  He also bumped toe before this swelling reaction occurred.  He has also had previous gout in right foot and has elevated uric acid even in the presence of an acute attack.  Recommend treatrment with antibiotic but start prednisone now also to reduce this acute reaction.  Discussed tapping the joint, but most indications are acute gout and we will hold off the tap at this time.  Electronic Signatures: Epimenio Sarinroxler, Reynoldo Mainer G (MD)  (Signed 21-Mar-14 16:38)  Authored: Brief Consult Note   Last Updated: 21-Mar-14 16:38 by Epimenio Sarinroxler, Malya Cirillo G (MD)

## 2015-03-03 NOTE — Consult Note (Signed)
PATIENT NAME:  Randy Downs, Dillard A MR#:  [id number] DATE OF BIRTH:  11/21/1955  DATE OF CONSULTATION:  01/29/2013  REFERRING PHYSICIAN:  Dr. Sherryll BurgerShah. CONSULTING PHYSICIAN:  Kandyce RudGeorge W. Kernodle Jr., MD  REASON FOR VISIT:  Possible gout.   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  He has a history of bipolar and also history of hypertension, is [facility].  He had podagra in the right first MTP.  It was very tender and swollen, gradually resolved.  About 1-1/2 weeks ago he stubbed his left second toe on the bathroom scale.  A few days after that it became red and swollen and tender.  The first MTP bothered him and he developed swelling up into the midfoot.  He was not diabetic.  He went to the Emergency Room.  Sed rate normal.  Uric acid in the 7 range.  White count normal.  He was given antibiotic.  He has taken some Aleve or Advil over-the-counter as well as an analgesic.  Pain and swelling got worse.  He came to the Emergency Room where he is admitted for possible infection.  X-ray showed an abnormality of the second toe, but he had that operated previously with osteomyelitis.  He has not had any bite.  He has not had systemic fever.  He has not had pain in any other joints, has no history of psoriasis.  No abdominal pain.  No constitutional symptoms.  Labs were pertinent for a white count of 8600, a recent uric acid of 7.4, normal sed rate, but creatinine of 1.37.   PAST MEDICAL HISTORY:  Hypertension, bipolar, gout.   SOCIAL HISTORY:  No alcohol.   FAMILY HISTORY:  No history of gout.   REVIEW OF SYSTEMS:   As above.   PHYSICAL EXAMINATION: GENERAL:  Pleasant male, in bed with the left foot elevated.  VITAL SIGNS:  Temperature 98, blood pressure 150/91, pulse 92.    HEENT:  Sclerae clear.  Clear oropharynx.  CHEST:  Clear.  HEART:  No murmur.  ABDOMEN:  No visceromegaly.  MUSCULOSKELETAL:  Good range of neck and shoulders.  No  olecranon nodules.  No hand synovitis.  The left first MTP is swollen and tender and erythematous.  Left second toe is erythematous.  It is very tender to move either of those toes.  Left third toe is mildly tender.  There is inflammation and erythema up the midfoot bilaterally.  Ankle moves well.  Right foot moves well.  Good distal pulses.  Knees without effusion.  Hips move well.   IMPRESSION: 1.  Aggressive tenosynovitis of the left foot, most likely crystalline rather than septic as he has no bite, skin cut or diabetes, prior history of gout.  2.  Prior history of surgery of the left second MTP.  3.  Renal insufficiency.   PLAN:  Agree with Dr. Orland Jarredroxler.  Consider prednisone taper.  Might add Colcrys 0.6 twice daily.  Hesitant to use nonsteroidals because of his renal insufficiency.  He is offered aspiration by Dr.Troxler and declined, but that could be reconsidered if this does not improve.      ____________________________ Kandyce RudGeorge W. Kernodle Jr., MD gwk:ea D: 01/29/2013 18:16:03 ET T: 01/30/2013 03:37:50 ET JOB#: [id number]  cc: Kandyce RudGeorge W. Kernodle Jr., MD, <Dictator> Dr. Kirstie Mirzaroxler [facility] Aaden Buckman W KERNODLE J MD ELECTRONICALLY SIGNED 02/01/2013 8:13

## 2015-04-15 DIAGNOSIS — Z87891 Personal history of nicotine dependence: Secondary | ICD-10-CM | POA: Insufficient documentation

## 2015-04-15 DIAGNOSIS — Z792 Long term (current) use of antibiotics: Secondary | ICD-10-CM | POA: Insufficient documentation

## 2015-04-15 DIAGNOSIS — S61411A Laceration without foreign body of right hand, initial encounter: Secondary | ICD-10-CM | POA: Insufficient documentation

## 2015-04-15 DIAGNOSIS — I10 Essential (primary) hypertension: Secondary | ICD-10-CM | POA: Insufficient documentation

## 2015-04-15 DIAGNOSIS — Z79899 Other long term (current) drug therapy: Secondary | ICD-10-CM | POA: Insufficient documentation

## 2015-04-15 DIAGNOSIS — Y998 Other external cause status: Secondary | ICD-10-CM | POA: Insufficient documentation

## 2015-04-15 DIAGNOSIS — W28XXXA Contact with powered lawn mower, initial encounter: Secondary | ICD-10-CM | POA: Insufficient documentation

## 2015-04-15 DIAGNOSIS — Y93H2 Activity, gardening and landscaping: Secondary | ICD-10-CM | POA: Insufficient documentation

## 2015-04-15 DIAGNOSIS — Y288XXA Contact with other sharp object, undetermined intent, initial encounter: Secondary | ICD-10-CM | POA: Insufficient documentation

## 2015-04-15 DIAGNOSIS — Y9289 Other specified places as the place of occurrence of the external cause: Secondary | ICD-10-CM | POA: Insufficient documentation

## 2015-04-16 ENCOUNTER — Encounter: Payer: Self-pay | Admitting: *Deleted

## 2015-04-16 ENCOUNTER — Emergency Department
Admission: EM | Admit: 2015-04-16 | Discharge: 2015-04-16 | Disposition: A | Discharge status: Home / Self Care | Payer: Self-pay | Attending: Emergency Medicine | Admitting: Emergency Medicine

## 2015-04-16 ENCOUNTER — Emergency Department: Payer: Self-pay

## 2015-04-16 DIAGNOSIS — S61411A Laceration without foreign body of right hand, initial encounter: Secondary | ICD-10-CM

## 2015-04-16 HISTORY — DX: Osteomyelitis, unspecified: M86.9

## 2015-04-16 HISTORY — DX: Unspecified osteoarthritis, unspecified site: M19.90

## 2015-04-16 HISTORY — DX: Essential (primary) hypertension: I10

## 2015-04-16 HISTORY — DX: Bipolar disorder, unspecified: F31.9

## 2015-04-16 MED ORDER — AMOXICILLIN-POT CLAVULANATE 875-125 MG PO TABS: 1.0000 | ORAL_TABLET | Freq: Two times a day (BID) | ORAL | Status: DC

## 2015-04-16 NOTE — ED Provider Notes (Signed)
[facility] Emergency Department Provider Note  ____________________________________________  Time seen: 1:30 AM  I have reviewed the triage vital signs and the nursing notes.   HISTORY  Chief Complaint Hand Injury    HPI Randy Downs is a 59 y.o. male who was riding a lawnmower with a screwdriver in his hand when he hit a bump causing him to accidentally stabbed his right palm with the [facility] head screwdriver. He removed it right away and it was quickly hemostatic, but he complains of pain to the right palmar hand where the stab wound is as well as paresthesia of the fourth and fifth digits which are distal to the wound.     Past Medical History  Diagnosis Date  . Arthritis   . Bipolar 1 disorder   . Hypertension   . Osteomyelitis     L foot    There are no active problems to display for this patient.   Past Surgical History  Procedure Laterality Date  . Foot surgery Left   . Hand surgery Left     Current Outpatient Rx  Name  Route  Sig  Dispense  Refill  . ARIPiprazole (ABILIFY) 5 MG tablet   Oral   Take 5 mg by mouth daily.         Marland Kitchen amoxicillin-clavulanate (AUGMENTIN) 875-125 MG per tablet   Oral   Take 1 tablet by mouth 2 (two) times daily.   20 tablet   0     Allergies Haldol  History reviewed. No pertinent family history.  Social History History  Substance Use Topics  . Smoking status: Former Games developer  . Smokeless tobacco: Never Used  . Alcohol Use: No    Review of Systems  Constitutional: No fever or chills. No weight changes Eyes:No blurry vision or double vision.  ENT: No sore throat. Cardiovascular: No chest pain. Respiratory: No dyspnea or cough. Gastrointestinal: Negative for abdominal pain, vomiting and diarrhea.  No BRBPR or melena. Genitourinary: Negative for dysuria, urinary retention, bloody urine, or difficulty urinating. Musculoskeletal: Negative for back pain. No joint swelling or pain. Skin:  Negative for rash. Neurological: Negative for headaches, focal weakness or numbness. Psychiatric:No anxiety or depression.   Endocrine:No hot/cold intolerance, changes in energy, or sleep difficulty.  10-point ROS otherwise negative.  ____________________________________________   PHYSICAL EXAM:  VITAL SIGNS: ED Triage Vitals  Enc Vitals Group     BP 04/15/15 2349 145/84 mmHg     Pulse Rate 04/15/15 2349 62     Resp 04/15/15 2349 20     Temp 04/15/15 2349 98.1 F (36.7 C)     Temp Source 04/15/15 2349 Oral     SpO2 04/15/15 2349 96 %     Weight 04/15/15 2349 225 lb (102.059 kg)     Height 04/15/15 2349  (1.854 m)     Head Cir --      Peak Flow --      Pain Score 04/16/15 0003 5     Pain Loc --      Pain Edu? --      Excl. in GC? --      Constitutional: Alert and oriented. Well appearing and in no distress. ENT   Head: Normocephalic and atraumatic.  Musculoskeletal: Right palmar hand with a less than 1 cm irregular stab wound laceration in the area of the midshaft metacarpals proximal to the fourth and fifth digits . Full range of motion in all fingers wrist and joints. No swelling  erythema or drainage.  Otherwise Nontender with normal range of motion in all extremities. No joint effusions.  No lower extremity tenderness.  No edema. Neurologic:   Normal speech and language.  CN 2-10 normal. Motor grossly intact. No pronator drift.  Normal gait. Patient reports decreased sensation in the right fourth and fifth digits with palpation Skin:  Skin is warm, dry and intact. No rash noted.  No petechiae, purpura, or bullae. Psychiatric: Mood and affect are normal. Speech and behavior are normal. Patient exhibits appropriate insight and judgment.  ____________________________________________    LABS (pertinent positives/negatives) (all labs ordered are listed, but only abnormal results are displayed) Labs Reviewed - No data to  display ____________________________________________   EKG    ____________________________________________    RADIOLOGY  X-ray hand unremarkable  ____________________________________________   PROCEDURES LACERATION REPAIR Performed by: Scotty CourtSTAFFORD, Sherrill Buikema Authorized by: Sharman CheekSTAFFORD, Johnnie Goynes Consent: Verbal consent obtained. Risks and benefits: risks, benefits and alternatives were discussed Consent given by: patient Patient identity confirmed: provided demographic data Prepped and Draped in normal sterile fashion Wound explored  Laceration Location: Right palmar hand  Laceration Length: 1cm  No Foreign Bodies seen or palpated  Anesthesia: None Irrigation method: syringe Amount of cleaning: Copious, greater than 100 mL sterile saline   Skin closure: None   Patient tolerance: Patient tolerated the procedure well with no immediate complications.  ____________________________________________   INITIAL IMPRESSION / ASSESSMENT AND PLAN / ED COURSE  Pertinent labs & imaging results that were available during my care of the patient were reviewed by me and considered in my medical decision making (see chart for details).  No retained foreign body or fracture. No other osseous or tendon injury. Patient has full function of the digits and wrist. Aggressive wound care provided. Patient counseled on warning signs of infection. We'll prescribe Augmentin for prevention. No evidence of tenosynovitis or other infection at this time. Tetanus is up-to-date within the last few months.  ____________________________________________   FINAL CLINICAL IMPRESSION(S) / ED DIAGNOSES  Final diagnoses:  Hand laceration, right, initial encounter      Sharman CheekPhillip Afreen Siebels, MD 04/16/15 [phone]

## 2015-04-16 NOTE — Discharge Instructions (Signed)
Laceration Care, Adult °A laceration is a cut or lesion that goes through all layers of the skin and into the tissue just beneath the skin. °TREATMENT  °Some lacerations may not require closure. Some lacerations may not be able to be closed due to an increased risk of infection. It is important to see your caregiver as soon as possible after an injury to minimize the risk of infection and maximize the opportunity for successful closure. °If closure is appropriate, pain medicines may be given, if needed. The wound will be cleaned to help prevent infection. Your caregiver will use stitches (sutures), staples, wound glue (adhesive), or skin adhesive strips to repair the laceration. These tools bring the skin edges together to allow for faster healing and a better cosmetic outcome. However, all wounds will heal with a scar. Once the wound has healed, scarring can be minimized by covering the wound with sunscreen during the day for 1 full year. °HOME CARE INSTRUCTIONS  °For sutures or staples: °· Keep the wound clean and dry. °· If you were given a bandage (dressing), you should change it at least once a day. Also, change the dressing if it becomes wet or dirty, or as directed by your caregiver. °· Wash the wound with soap and water 2 times a day. Rinse the wound off with water to remove all soap. Pat the wound dry with a clean towel. °· After cleaning, apply a thin layer of the antibiotic ointment as recommended by your caregiver. This will help prevent infection and keep the dressing from sticking. °· You may shower as usual after the first 24 hours. Do not soak the wound in water until the sutures are removed. °· Only take over-the-counter or prescription medicines for pain, discomfort, or fever as directed by your caregiver. °· Get your sutures or staples removed as directed by your caregiver. °For skin adhesive strips: °· Keep the wound clean and dry. °· Do not get the skin adhesive strips wet. You may bathe  carefully, using caution to keep the wound dry. °· If the wound gets wet, pat it dry with a clean towel. °· Skin adhesive strips will fall off on their own. You may trim the strips as the wound heals. Do not remove skin adhesive strips that are still stuck to the wound. They will fall off in time. °For wound adhesive: °· You may briefly wet your wound in the shower or bath. Do not soak or scrub the wound. Do not swim. Avoid periods of heavy perspiration until the skin adhesive has fallen off on its own. After showering or bathing, gently pat the wound dry with a clean towel. °· Do not apply liquid medicine, cream medicine, or ointment medicine to your wound while the skin adhesive is in place. This may loosen the film before your wound is healed. °· If a dressing is placed over the wound, be careful not to apply tape directly over the skin adhesive. This may cause the adhesive to be pulled off before the wound is healed. °· Avoid prolonged exposure to sunlight or tanning lamps while the skin adhesive is in place. Exposure to ultraviolet light in the first year will darken the scar. °· The skin adhesive will usually remain in place for 5 to 10 days, then naturally fall off the skin. Do not pick at the adhesive film. °You may need a tetanus shot if: °· You cannot remember when you had your last tetanus shot. °· You have never had a tetanus   shot. °If you get a tetanus shot, your arm may swell, get red, and feel warm to the touch. This is common and not a problem. If you need a tetanus shot and you choose not to have one, there is a rare chance of getting tetanus. Sickness from tetanus can be serious. °SEEK MEDICAL CARE IF:  °· You have redness, swelling, or increasing pain in the wound. °· You see a red line that goes away from the wound. °· You have yellowish-white fluid (pus) coming from the wound. °· You have a fever. °· You notice a bad smell coming from the wound or dressing. °· Your wound breaks open before or  after sutures have been removed. °· You notice something coming out of the wound such as wood or glass. °· Your wound is on your hand or foot and you cannot move a finger or toe. °SEEK IMMEDIATE MEDICAL CARE IF:  °· Your pain is not controlled with prescribed medicine. °· You have severe swelling around the wound causing pain and numbness or a change in color in your arm, hand, leg, or foot. °· Your wound splits open and starts bleeding. °· You have worsening numbness, weakness, or loss of function of any joint around or beyond the wound. °· You develop painful lumps near the wound or on the skin anywhere on your body. °MAKE SURE YOU:  °· Understand these instructions. °· Will watch your condition. °· Will get help right away if you are not doing well or get worse. °Document Released: 10/28/2005 Document Revised: 01/20/2012 Document Reviewed: 04/23/2011 °ExitCare® Patient Information ©2015 [employer]. This information is not intended to replace advice given to you by your health care provider. Make sure you discuss any questions you have with your health care provider. ° °Non-Sutured Laceration °A laceration is a cut or wound that goes through all layers of the skin and into the tissue just beneath the skin. Usually, these are stitched up or held together with tape or glue shortly after the injury occurred. However, if several or more hours have passed before getting care, too many germs (bacteria) get into the laceration. Stitching it closed would bring the risk of infection. If your health care provider feels your laceration is too old, it may be left open and then bandaged to allow healing from the bottom layer up. °HOME CARE INSTRUCTIONS  °· Change the bandage (dressing) 2 times a day or as directed by your health care provider. °· If the dressing or packing gauze sticks, soak it off with soapy water. °· When you re-bandage your laceration, make sure that the dressing or packing gauze goes all the way to the  bottom of the laceration. The top of the laceration is kept open so it can heal from the bottom up. There is less chance for infection with this method. °· Wash the area with soap and water 2 times a day to remove all the creams or ointments, if used. Rinse off the soap. Pat the area dry with a clean towel. Look for signs of infection, such as redness, swelling, or a red line that goes away from the laceration. °· Re-apply creams or ointments if they were used to bandage the laceration. This helps keep the bandage from sticking. °· If the bandage becomes wet, dirty, or has a bad smell, change it as soon as possible. °· Only take medicine as directed by your health care provider. °You might need a tetanus shot now if: °· You have   no idea when you had the last one. °· You have never had a tetanus shot before. °· Your laceration had dirt in it. °· Your laceration was dirty, and your last tetanus shot was more than 7 years ago. °· Your laceration was clean, and your last tetanus shot was more than 10 years ago. °If you need a tetanus shot, and you decide not to get one, there is a rare chance of getting tetanus. Sickness from tetanus can be serious. If you got a tetanus shot, your arm may swell and get red and warm to the touch at the shot site. This is common and not a problem. °SEEK MEDICAL CARE IF:  °· You have redness, swelling, or increasing pain in the laceration. °· You notice a red line that goes away from your laceration. °· You have pus coming from the laceration. °· You have a fever. °· You notice a bad smell coming from the laceration or dressing. °· You notice something coming out of the laceration, such as wood or glass. °· Your laceration is on your hand or foot and you are unable to properly move a finger or toe. °· You have severe swelling around the laceration, causing pain and numbness. °· You notice a change in color in your arm, hand, leg, or foot. °MAKE SURE YOU:  °· Understand these  instructions. °· Will watch your condition. °· Will get help right away if you are not doing well or get worse. °Document Released: 09/25/2006 Document Revised: 11/02/2013 Document Reviewed: 04/17/2009 °ExitCare® Patient Information ©2015 [employer]. This information is not intended to replace advice given to you by your health care provider. Make sure you discuss any questions you have with your health care provider. ° °

## 2015-04-16 NOTE — ED Notes (Signed)
Pt with pain to palpation of right palm. No redness noted to right hand or forearm. Pt with 4th and 5th right fingers bent at approx 45 degree angle, pt unable to extend.

## 2015-04-16 NOTE — ED Notes (Signed)
Pt sustained puncture wound to R palm. Pt states he has no feeling in R 4th and 5th digits since the injury. Pt states he thought that would get better but hasn't. Pt has swelling and redness to R hand. 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

## 2015-04-16 NOTE — ED Notes (Signed)
Patient is resting comfortably. 

## 2016-04-04 ENCOUNTER — Emergency Department: Payer: Medicare Other

## 2016-04-04 ENCOUNTER — Encounter: Payer: Self-pay | Admitting: Emergency Medicine

## 2016-04-04 ENCOUNTER — Emergency Department
Admission: EM | Admit: 2016-04-04 | Discharge: 2016-04-05 | Disposition: A | Discharge status: Home / Self Care | Payer: Medicare Other | Attending: Emergency Medicine | Admitting: Emergency Medicine

## 2016-04-04 DIAGNOSIS — Z87891 Personal history of nicotine dependence: Secondary | ICD-10-CM | POA: Insufficient documentation

## 2016-04-04 DIAGNOSIS — I10 Essential (primary) hypertension: Secondary | ICD-10-CM | POA: Insufficient documentation

## 2016-04-04 DIAGNOSIS — Z79899 Other long term (current) drug therapy: Secondary | ICD-10-CM | POA: Diagnosis not present

## 2016-04-04 DIAGNOSIS — M1990 Unspecified osteoarthritis, unspecified site: Secondary | ICD-10-CM | POA: Diagnosis not present

## 2016-04-04 DIAGNOSIS — R0902 Hypoxemia: Secondary | ICD-10-CM | POA: Diagnosis not present

## 2016-04-04 DIAGNOSIS — R0600 Dyspnea, unspecified: Secondary | ICD-10-CM

## 2016-04-04 DIAGNOSIS — F319 Bipolar disorder, unspecified: Secondary | ICD-10-CM | POA: Diagnosis not present

## 2016-04-04 LAB — BLOOD GAS, ARTERIAL
ALLENS TEST (PASS/FAIL): POSITIVE — AB
Acid-Base Excess: 2.2 mmol/L (ref 0.0–3.0)
Bicarbonate: 27.3 mEq/L (ref 21.0–28.0)
FIO2: 0.21
O2 SAT: 91.2 %
PATIENT TEMPERATURE: 37
PO2 ART: 61 mmHg — AB (ref 83.0–108.0)
pCO2 arterial: 43 mmHg (ref 32.0–48.0)
pH, Arterial: 7.41 (ref 7.350–7.450)

## 2016-04-04 LAB — CBC
HCT: 42.7 % (ref 40.0–52.0)
Hemoglobin: 14.6 g/dL (ref 13.0–18.0)
MCH: 27.9 pg (ref 26.0–34.0)
MCHC: 34.1 g/dL (ref 32.0–36.0)
MCV: 81.8 fL (ref 80.0–100.0)
PLATELETS: 155 10*3/uL (ref 150–440)
RBC: 5.22 MIL/uL (ref 4.40–5.90)
RDW: 13.7 % (ref 11.5–14.5)
WBC: 7.8 10*3/uL (ref 3.8–10.6)

## 2016-04-04 LAB — COMPREHENSIVE METABOLIC PANEL
ALT: 54 U/L (ref 17–63)
AST: 56 U/L — AB (ref 15–41)
Albumin: 4.1 g/dL (ref 3.5–5.0)
Alkaline Phosphatase: 106 U/L (ref 38–126)
Anion gap: 9 (ref 5–15)
BUN: 14 mg/dL (ref 6–20)
CHLORIDE: 104 mmol/L (ref 101–111)
CO2: 24 mmol/L (ref 22–32)
CREATININE: 1.5 mg/dL — AB (ref 0.61–1.24)
Calcium: 9.3 mg/dL (ref 8.9–10.3)
GFR calc non Af Amer: 49 mL/min — ABNORMAL LOW (ref >60)
GFR, EST AFRICAN AMERICAN: 57 mL/min — AB (ref >60)
Glucose, Bld: 99 mg/dL (ref 65–99)
POTASSIUM: 4 mmol/L (ref 3.5–5.1)
SODIUM: 137 mmol/L (ref 135–145)
Total Bilirubin: 0.8 mg/dL (ref 0.3–1.2)
Total Protein: 7.4 g/dL (ref 6.5–8.1)

## 2016-04-04 LAB — TROPONIN I

## 2016-04-04 LAB — FIBRIN DERIVATIVES D-DIMER (ARMC ONLY): Fibrin derivatives D-dimer (ARMC): 201 (ref 0–499)

## 2016-04-04 LAB — BRAIN NATRIURETIC PEPTIDE: B Natriuretic Peptide: 14 pg/mL (ref 0.0–100.0)

## 2016-04-04 MED ORDER — SODIUM CHLORIDE 0.9 % IV BOLUS (SEPSIS)
1000.0000 mL | Freq: Once | INTRAVENOUS | Status: AC
  Administered 2016-04-04: 1000 mL via INTRAVENOUS

## 2016-04-04 NOTE — ED Notes (Addendum)
Pt c/o cough, SHOB, generalized body aches. Denies fevers at this time. Pt able to speak in complete sentences at this time. Pt states SHOB worse at night. Pt states cough is sometimes productive. [facility]

## 2016-04-04 NOTE — ED Provider Notes (Signed)
[facility] Emergency Department Provider Note   ____________________________________________  Time seen: Approximately 5:50 PM  I have reviewed the triage vital signs and the nursing notes.   HISTORY  Chief Complaint Cough; Generalized Body Aches; and Shortness of Breath    HPI Randy Downs is a 60 y.o. male who is complaining that he's had progressive worsening shortness of breath and dyspnea on exertion for the past week. Patient also states that he has been unable to lie flat to sleep, and has had to prop himself up to breathe. Patient denies any associated chest pain. Patient states he has had a cough productive of a clear phlegm over the past week as well. Patient denies any history of lung or heart problems. Patient also stated that henoticed that he had a little bit of swelling on his legs at night bilaterally but by morning the swelling will go down. Patient is currently taking Augmentin for his cough and Abilify. Patient denies any fever, chills, nausea, vomiting, change in his bowels, headache, dizziness, weakness or any other symptoms other than the shortness of breath, cough, occasional leg swelling and dyspnea on exertion.   Past Medical History  Diagnosis Date  . Arthritis   . Bipolar 1 disorder (HCC)   . Hypertension   . Osteomyelitis (HCC)     L foot    There are no active problems to display for this patient.   Past Surgical History  Procedure Laterality Date  . Foot surgery Left   . Hand surgery Left     Current Outpatient Rx  Name  Route  Sig  Dispense  Refill  . amoxicillin-clavulanate (AUGMENTIN) 875-125 MG per tablet   Oral   Take 1 tablet by mouth 2 (two) times daily.   20 tablet   0   . ARIPiprazole (ABILIFY) 5 MG tablet   Oral   Take 5 mg by mouth daily.           Allergies Haldol  History reviewed. No pertinent family history.  Social History Social History  Substance Use Topics  . Smoking status:  Former Games developer  . Smokeless tobacco: Never Used  . Alcohol Use: No    Review of Systems Constitutional: No fever/chills Eyes: No visual changes. ENT: No sore throat. Cardiovascular: Denies chest pain. Respiratory: Positive for shortness of breath, dyspnea on exertion, two-pillow orthopnea, and cough productive of clear phlegm worsening over the past week. Gastrointestinal: No abdominal pain.  No nausea, no vomiting.  No diarrhea.  No constipation. Genitourinary: Negative for dysuria. Musculoskeletal: Negative for back pain. Patient has had intermittent swelling to his bilateral lower legs especially at night. Skin: Negative for rash. Neurological: Negative for headaches, focal weakness or numbness.  10-point ROS otherwise negative.  ____________________________________________   PHYSICAL EXAM:  VITAL SIGNS: ED Triage Vitals  Enc Vitals Group     BP 04/04/16 1620 159/87 mmHg     Pulse Rate 04/04/16 1620 85     Resp 04/04/16 1620 20     Temp 04/04/16 1620 98.2 F (36.8 C)     Temp Source 04/04/16 1620 Oral     SpO2 04/04/16 1620 96 %     Weight 04/04/16 1620 235 lb (106.595 kg)     Height 04/04/16 1620 6\' 1"  (1.854 m)     Head Cir --      Peak Flow --      Pain Score 04/04/16 1621 4     Pain Loc --  Pain Edu? --      Excl. in GC? --     Constitutional: Alert and oriented. Well appearing and in no acute distress.Patient occasionally gets tachypneic with talking but is otherwise in no acute extremeness Eyes: Conjunctivae are normal. PERRL. EOMI. Head: Atraumatic. Nose: No congestion/rhinnorhea. Mouth/Throat: Mucous membranes are moist.  Oropharynx non-erythematous. Neck: No stridor.   Cardiovascular: Normal rate, regular rhythm. Grossly normal heart sounds.  Good peripheral circulation. Respiratory: Normal respiratory effort.  No retractions. Lungs CTAB. Gastrointestinal: Soft and nontender. No distention. No abdominal bruits. No CVA tenderness. Musculoskeletal: No  lower extremity tenderness nor edema.  No joint effusions.There is no notable swelling to his legs bilaterally today or calf tenderness. Neurologic:  Normal speech and language. No gross focal neurologic deficits are appreciated. No gait instability. Skin:  Skin is warm, dry and intact. No rash noted. Psychiatric: Mood and affect are normal. Speech and behavior are normal.  ____________________________________________   LABS (all labs ordered are listed, but only abnormal results are displayed)  Labs Reviewed  COMPREHENSIVE METABOLIC PANEL - Abnormal; Notable for the following:    Creatinine, Ser 1.50 (*)    AST 56 (*)    GFR calc non Af Amer 49 (*)    GFR calc Af Amer 57 (*)    All other components within normal limits  BLOOD GAS, ARTERIAL - Abnormal; Notable for the following:    pO2, Arterial 61 (*)    Allens test (pass/fail) POSITIVE (*)    All other components within normal limits  CBC  BRAIN NATRIURETIC PEPTIDE  TROPONIN I  FIBRIN DERIVATIVES D-DIMER ([facility] ONLY)   ____________________________________________  EKG ED ECG REPORT I, Leona Carry, the attending physician, personally viewed and interpreted this ECG.   Date: 04/04/2016  EKG Time: 1628  Rate: 85  Rhythm: normal sinus rhythm  Axis: Normal  Intervals:Poor R-wave progression  ST&T Change: ST depression and flipped T waves inferiorly and minimal ST depression laterally   ____________________________________________  RADIOLOGY Dg Chest 2 View  04/04/2016  CLINICAL DATA:  Shortness of breath and cough 1 week. EXAM: CHEST  2 VIEW COMPARISON:  02/09/2013 and 02/08/2013 FINDINGS: Lungs are well inflated without focal consolidation or effusion. Cardiomediastinal silhouette, bones and soft tissues within normal. IMPRESSION: No active cardiopulmonary disease. Electronically Signed   By: Elberta Fortis M.D.   On: 04/04/2016 16:50     ____________________________________________   PROCEDURES  Procedure(s)  performed: None  Critical Care performed: No  ____________________________________________   INITIAL IMPRESSION / ASSESSMENT AND PLAN / ED COURSE  Pertinent labs & imaging results that were available during my care of the patient were reviewed by me and considered in my medical decision making (see chart for details).  Patient with EKG, chest x-ray, and routine labs to evaluate. Patient will also get a d-dimer and ABG.  11:56 PM Patient's creatinine is mildly elevated therefore really give a liter of IV fluids. Still awaiting patient's d-dimer. All other labs and x-rays have been normal.  11:56 PM Patient's PO2 on his blood gas was 61 which was fairly low but no explanation. Patient's d-dimer was negative so we felt there was probably not a PE. Patient's creatinine was mildly elevated and we did not want to use IV contrast when doing a CT but decided that we should probably do at least a CT without contrast to see if there is any explanation for this patient's hypoxemia. Patient will be signed out to Dr. Zenda Alpers at 12 midnight. ____________________________________________  FINAL CLINICAL IMPRESSION(S) / ED DIAGNOSES  Final diagnoses:  Dyspnea  Hypoxemia      NEW MEDICATIONS STARTED DURING THIS VISIT:  New Prescriptions   No medications on file     Note:  This document was prepared using Dragon voice recognition software and may include unintentional dictation errors.    Leona CarryLinda M Dalynn Jhaveri, MD 04/04/16 [phone]

## 2016-04-05 ENCOUNTER — Emergency Department: Payer: Medicare Other

## 2016-04-05 NOTE — ED Provider Notes (Addendum)
-----------------------------------------   2:02 AM on 04/05/2016 -----------------------------------------   Blood pressure 127/91, pulse 60, temperature 98.2 F (36.8 C), temperature source Oral, resp. rate 22, height 6\' 1"  (1.854 m), weight 235 lb (106.595 kg), SpO2 95 %.  Assuming care from Dr. Ladona Ridgelaylor.  In short, [name] is a 60 y.o. male with a chief complaint of Cough; Generalized Body Aches; and Shortness of Breath .  Refer to the original H&P for additional details.  The current plan of care is to follow up the CT scan.  CT chest: Question of focal distention of the proximal and mid esophagus just above the level of the carina this may be transient in nature though an underlying mass cannot be entirely excluded. Otherwise unremarkable noncontrast CT of the chest.  I did go to the patient and explained the results of his CT scan. The patient asked about his shortness of breath especially when he is asleep. The patient did report that he has a history of sleep apnea but does not use a CPAP machine. I explained to the patient that this could be the cause of his nocturnal shortness of breath and dyspnea. I informed him that sleep apnea can cause some significant shortness of breath. I explained to the patient that he needs to follow-up with cardiology as well as his primary care physician. I will give the patient a referral to a cardiologist with whom he should be able to follow-up tomorrow. Although the patient's PaO2 is 61 he has not had any hypoxemia while here in the emergency department. The patient's pulse has been stable and he has not been in distress while in the emergency department. The patient should also contact his primary care physician for further evaluation of his symptoms. At this time the patient's vital signs are unremarkable and he will be discharged to home.   Rebecka ApleyAllison P Claude Swendsen, MD 04/05/16 [id number]  Rebecka ApleyAllison P Elease Swarm, MD 04/05/16 [phone]

## 2016-04-05 NOTE — Discharge Instructions (Signed)

## 2017-01-01 DIAGNOSIS — R0789 Other chest pain: Secondary | ICD-10-CM | POA: Insufficient documentation

## 2017-01-01 DIAGNOSIS — I10 Essential (primary) hypertension: Secondary | ICD-10-CM | POA: Insufficient documentation

## 2017-01-22 DIAGNOSIS — K4090 Unilateral inguinal hernia, without obstruction or gangrene, not specified as recurrent: Secondary | ICD-10-CM | POA: Insufficient documentation

## 2017-06-14 ENCOUNTER — Emergency Department
Admission: EM | Admit: 2017-06-14 | Discharge: 2017-06-16 | Disposition: A | Discharge status: Other Acute Inpatient Hospital | Payer: Medicare Other | Attending: Emergency Medicine | Admitting: Emergency Medicine

## 2017-06-14 ENCOUNTER — Encounter: Payer: Self-pay | Admitting: Emergency Medicine

## 2017-06-14 DIAGNOSIS — F329 Major depressive disorder, single episode, unspecified: Secondary | ICD-10-CM | POA: Insufficient documentation

## 2017-06-14 DIAGNOSIS — Z79899 Other long term (current) drug therapy: Secondary | ICD-10-CM | POA: Insufficient documentation

## 2017-06-14 DIAGNOSIS — Z87891 Personal history of nicotine dependence: Secondary | ICD-10-CM | POA: Insufficient documentation

## 2017-06-14 DIAGNOSIS — I10 Essential (primary) hypertension: Secondary | ICD-10-CM | POA: Insufficient documentation

## 2017-06-14 DIAGNOSIS — F32A Depression, unspecified: Secondary | ICD-10-CM

## 2017-06-14 LAB — URINE DRUG SCREEN, QUALITATIVE (ARMC ONLY)
AMPHETAMINES, UR SCREEN: NOT DETECTED
Barbiturates, Ur Screen: NOT DETECTED
Benzodiazepine, Ur Scrn: NOT DETECTED
CANNABINOID 50 NG, UR ~~LOC~~: NOT DETECTED
COCAINE METABOLITE, UR ~~LOC~~: NOT DETECTED
MDMA (ECSTASY) UR SCREEN: NOT DETECTED
Methadone Scn, Ur: NOT DETECTED
Opiate, Ur Screen: NOT DETECTED
Phencyclidine (PCP) Ur S: NOT DETECTED
Tricyclic, Ur Screen: NOT DETECTED

## 2017-06-14 MED ORDER — LORAZEPAM 2 MG/ML IJ SOLN
2.0000 mg | Freq: Once | INTRAMUSCULAR | Status: AC
  Administered 2017-06-14: 2 mg via INTRAMUSCULAR
  Filled 2017-06-14: qty 1

## 2017-06-14 MED ORDER — DIPHENHYDRAMINE HCL 50 MG/ML IJ SOLN
50.0000 mg | Freq: Once | INTRAMUSCULAR | Status: AC
  Administered 2017-06-14: 50 mg via INTRAMUSCULAR
  Filled 2017-06-14: qty 1

## 2017-06-14 MED ORDER — HALOPERIDOL LACTATE 5 MG/ML IJ SOLN
5.0000 mg | Freq: Once | INTRAMUSCULAR | Status: AC
  Administered 2017-06-14: 5 mg via INTRAMUSCULAR
  Filled 2017-06-14: qty 1
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
## 2017-06-14 NOTE — ED Notes (Signed)
Pt refusing blood draw and dress out

## 2017-06-14 NOTE — ED Notes (Signed)
ENVIRONMENTAL ASSESSMENT  Potentially harmful objects out of patient reach: Yes.  Personal belongings secured: Yes.  Patient dressed in hospital provided attire only: No pt refusing and Dr Derrill KayGoodman did not wish to make pt agitated will attempt again in a little bit.   Plastic bags out of patient reach: Yes.  Patient care equipment (cords, cables, call bells, lines, and drains) shortened, removed, or accounted for: Yes.  Equipment and supplies removed from bottom of stretcher: Yes.  Potentially toxic materials out of patient reach: Yes.  Sharps container removed or out of patient reach: Yes.   BEHAVIORAL HEALTH ROUNDING  Patient sleeping: No.  Patient alert and oriented: yes  Behavior appropriate: Yes. ; If no, describe: calm and cooperative at this time.  Nutrition and fluids offered: Yes  Toileting and hygiene offered: Yes  Sitter present: not applicable, Q 15 min safety rounds and observation.  Law enforcement present: Yes ODS  Dr Derrill KayGoodman to bedside. Pt refusing to allow EKG or blood work at this time. Dr Derrill KayGoodman requesting to not have anything done at this time.

## 2017-06-14 NOTE — ED Notes (Signed)
BEHAVIORAL HEALTH ROUNDING Patient sleeping: Yes.   Patient alert and oriented: not applicable SLEEPING Behavior appropriate: Yes.  ; If no, describe: SLEEPING Nutrition and fluids offered: No SLEEPING Toileting and hygiene offered: NoSLEEPING Sitter present: not applicable, Q 15 min safety rounds and observation. Law enforcement present: Yes ODS 

## 2017-06-15 LAB — CBC
HCT: 40.6 % (ref 40.0–52.0)
Hemoglobin: 14 g/dL (ref 13.0–18.0)
MCH: 28.4 pg (ref 26.0–34.0)
MCHC: 34.4 g/dL (ref 32.0–36.0)
MCV: 82.5 fL (ref 80.0–100.0)
PLATELETS: 226 10*3/uL (ref 150–440)
RBC: 4.92 MIL/uL (ref 4.40–5.90)
RDW: 13.5 % (ref 11.5–14.5)
WBC: 9.1 10*3/uL (ref 3.8–10.6)

## 2017-06-15 LAB — COMPREHENSIVE METABOLIC PANEL
ALT: 41 U/L (ref 17–63)
AST: 52 U/L — ABNORMAL HIGH (ref 15–41)
Albumin: 3.9 g/dL (ref 3.5–5.0)
Alkaline Phosphatase: 87 U/L (ref 38–126)
Anion gap: 7 (ref 5–15)
BUN: 16 mg/dL (ref 6–20)
CHLORIDE: 106 mmol/L (ref 101–111)
CO2: 27 mmol/L (ref 22–32)
Calcium: 9.2 mg/dL (ref 8.9–10.3)
Creatinine, Ser: 1.43 mg/dL — ABNORMAL HIGH (ref 0.61–1.24)
GFR, EST NON AFRICAN AMERICAN: 52 mL/min — AB (ref >60)
Glucose, Bld: 111 mg/dL — ABNORMAL HIGH (ref 65–99)
POTASSIUM: 3.5 mmol/L (ref 3.5–5.1)
Sodium: 140 mmol/L (ref 135–145)
Total Bilirubin: 0.9 mg/dL (ref 0.3–1.2)
Total Protein: 6.9 g/dL (ref 6.5–8.1)

## 2017-06-15 LAB — ETHANOL

## 2017-06-15 LAB — SALICYLATE LEVEL

## 2017-06-15 LAB — ACETAMINOPHEN LEVEL

## 2017-06-15 MED ORDER — ZIPRASIDONE MESYLATE 20 MG IM SOLR
20.0000 mg | Freq: Once | INTRAMUSCULAR | Status: AC
  Administered 2017-06-15: 20 mg via INTRAMUSCULAR

## 2017-06-15 MED ORDER — LORAZEPAM 1 MG PO TABS
1.0000 mg | ORAL_TABLET | Freq: Every evening | ORAL | Status: DC | PRN
  Administered 2017-06-15: 1 mg via ORAL
  Filled 2017-06-15: qty 1

## 2017-06-15 MED ORDER — ZIPRASIDONE MESYLATE 20 MG IM SOLR: 20.0000 mg | Freq: Once | INTRAMUSCULAR | Status: DC

## 2017-06-15 MED ORDER — LORAZEPAM 2 MG PO TABS
2.0000 mg | ORAL_TABLET | Freq: Once | ORAL | Status: AC
  Administered 2017-06-15: 2 mg via ORAL

## 2017-06-15 MED ORDER — ZIPRASIDONE HCL 20 MG PO CAPS
80.0000 mg | ORAL_CAPSULE | Freq: Two times a day (BID) | ORAL | Status: DC | PRN
  Administered 2017-06-15: 80 mg via ORAL
  Filled 2017-06-15: qty 4

## 2017-06-15 MED ORDER — HALOPERIDOL LACTATE 5 MG/ML IJ SOLN
INTRAMUSCULAR | Status: AC
  Filled 2017-06-15: qty 1

## 2017-06-15 MED ORDER — ZIPRASIDONE MESYLATE 20 MG IM SOLR
INTRAMUSCULAR | Status: AC
  Administered 2017-06-15: 20 mg via INTRAMUSCULAR
  Filled 2017-06-15: qty 20

## 2017-06-15 MED ORDER — LORAZEPAM 2 MG/ML IJ SOLN
2.0000 mg | Freq: Once | INTRAMUSCULAR | Status: AC
  Administered 2017-06-15: 2 mg via INTRAMUSCULAR

## 2017-06-15 MED ORDER — LORAZEPAM 1 MG PO TABS
ORAL_TABLET | ORAL | Status: AC
  Administered 2017-06-15: 2 mg via ORAL
  Filled 2017-06-15: qty 2

## 2017-06-15 MED ORDER — LORAZEPAM 2 MG/ML IJ SOLN
INTRAMUSCULAR | Status: AC
  Administered 2017-06-15: 2 mg via INTRAMUSCULAR
  Filled 2017-06-15: qty 1

## 2017-06-15 NOTE — ED Notes (Signed)
Patient appears to sleeping. Eyes are closed and respirations WDL. Security and sitter present.

## 2017-06-15 NOTE — ED Provider Notes (Signed)
-----------------------------------------   10:46 PM on 06/15/2017 -----------------------------------------   Blood pressure 107/66, pulse 84, temperature 98.9 F (37.2 C), temperature source Oral, resp. rate 16, height 6\' 1"  (1.854 m), weight 94.3 kg (208 lb), SpO2 99 %.  [facility] because the patient reported that he was having right knee pain after standing up and sitting down. The knee was examined by the nurse and there was no swelling, deformity or warmth. The patient has had no further complaints. We will continue to observe the knee. We will hold imaging for now.     Myrna BlazerSchaevitz, David Matthew, MD 06/15/17 [phone]

## 2017-06-15 NOTE — ED Notes (Signed)
Bed placed back in room and pt encouraged to lay down on the bed and rest. Pt complied with the request.

## 2017-06-15 NOTE — ED Notes (Signed)
Pt singing "when the Saints go marching in" in a very loud voice, then yelling out. Pt encouraged to not yell but continues to sing loudly. MD informed and will place orders.

## 2017-06-15 NOTE — BH Assessment (Signed)
Referral information for Psychiatric Hospitalization faxed to;    [facility] [phone] or [phone]   [facility] [phone]),    [address] [phone]),    [facility] [phone]),    [facility] [phone]),    [place] [place] [phone]).

## 2017-06-15 NOTE — ED Provider Notes (Addendum)
-----------------------------------------   8:45 AM on 06/15/2017 -----------------------------------------  Signed at me this morning at 7:30 patient resting comfortably, in the interim though he has decided to get out of bed and rip the sink off the wall. Well least partially off the wall. Did not seem to hurt himself. He states he did it because the sink appeared to him to be was "disgusting". He states by given his tools he is happy to fix it. We'll give him some mild sedating medication because obviously we don't want him ripping the place apart and possibly hurting himself or anyone else. Patient will converse with me, is not making any violent gestures towards staff at this moment.   Jeanmarie PlantMcShane, James A, MD 06/15/17 [phone]   ----------------------------------------- 1:11 PM on 06/15/2017 ----------------------------------------- Patient did calm down for a while and now he is Patient yelling and screaming and flailing his arms around. We will give him a mild anxiolytic. He is not allergic to Haldol and allegedly causes "paralysis" which is not really a allergy A and B he already had it. Nonetheless we will give him Holland CommonsGeodon   McShane, James A, MD 06/15/17 [phone]

## 2017-06-15 NOTE — ED Notes (Signed)
Patient is eating his breakfast at this time.

## 2017-06-15 NOTE — ED Notes (Signed)
BEHAVIORAL HEALTH ROUNDING Patient sleeping: Yes.   Patient alert and oriented: not applicable SLEEPING Behavior appropriate: Yes.  ; If no, describe: SLEEPING Nutrition and fluids offered: No SLEEPING Toileting and hygiene offered: NoSLEEPING Sitter present: not applicable, Q 15 min safety rounds and observation. Law enforcement present: Yes ODS 

## 2017-06-15 NOTE — ED Notes (Signed)
[facility], RN to call back.

## 2017-06-15 NOTE — ED Notes (Signed)
PT IS IVC/ PENDING PLACEMENT  

## 2017-06-15 NOTE — ED Notes (Signed)
Pt snoring in hallway. Even and nonlabored respirations noted.

## 2017-06-15 NOTE — ED Notes (Addendum)
Pt in bathroom, digging in rectum. This RN opened door to check on patient. Pt yells at RN to "get out". Pt then to door to ask for help to put on shirt. Pt able to dress himself and out in hallway. Pt [facility] to [facility]. Pt states "I'm too heavily medicated to talk to a doctor". Pt alert and oriented, ambulatory with no difficulty.

## 2017-06-15 NOTE — ED Notes (Signed)
Pt pulled sink off of wall in room. Pt states "there was no caulk, it is disgusting, disease and germs all in." MD notified, 2mg  Ativan IM given, pt states Haldol is an allergy and causes paralysis. Haldol held at this time. ODS and [employer] at patient bedside for safety. Pt willingly took injection, Consulting civil engineercharge RN and AD notified of sink being off wall.

## 2017-06-15 NOTE — ED Notes (Signed)
Patient made up multiple excuses to delay medication ambisinister. Patient finally took medications after this Adult nursewriter printed info on medications. Patient has had to be redirected not to stand up in bed. Once patient sat down patient stated he hurt his knee. Patient did not fall. Patient sat into bed normally. Will continue to monitor.

## 2017-06-15 NOTE — ED Notes (Signed)
Pt moved to hallway bed so maintenance can work on sink. Pt calm and cooperative, TTS at bedside.

## 2017-06-15 NOTE — ED Notes (Signed)
Report given to Claris CheMargaret, RN. Rn informed of patient's behavior.

## 2017-06-15 NOTE — ED Notes (Signed)
Patient is resting at this time.

## 2017-06-15 NOTE — ED Notes (Signed)
Patient states he is going to sue hospital if any of the medications that has been given cause any lasting side effects.

## 2017-06-15 NOTE — ED Provider Notes (Signed)
-----------------------------------------   6:21 AM on 06/15/2017 -----------------------------------------   Blood pressure (!) 151/101, pulse 79, temperature 97.9 F (36.6 C), temperature source Oral, resp. rate 18, height 6\' 1"  (1.854 m), weight 94.3 kg (208 lb), SpO2 99 %.  The patient had no acute events since last update.  Calm and cooperative at this time.  Disposition is pending Psychiatry/Behavioral Medicine team recommendations.     Merrily Brittleifenbark, Toshiko Kemler, MD 06/15/17 [phone]

## 2017-06-15 NOTE — ED Notes (Signed)
BEHAVIORAL HEALTH ROUNDING  Patient sleeping: No.  Patient alert and oriented: yes  Behavior appropriate: no. ; If no, describe: pt pacing in room then throwing mattress of the bed into the floor. Bed frame removed from room for safety Nutrition and fluids offered: Yes  Toileting and hygiene offered: Yes  Sitter present: not applicable, Q 15 min safety rounds and observation.  Law enforcement present: Yes ODS
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
## 2017-06-15 NOTE — BH Assessment (Signed)
Writer called and left a HIPPA-compliant message for emergency contacts (pt's daughter and another contact), requesting a returned phone call.

## 2017-06-15 NOTE — ED Notes (Signed)
Pt back to hallway bed, unable to move to [facility] due to no availability of 1:1 sitter and male security guard.

## 2017-06-15 NOTE — ED Notes (Addendum)
Pt in hallway, spitting in floor. Dumped sprite onto bed and hopped in hallway. Pt states "I need help getting back in bed. " Pt's sheets changed, pt now trying to pull rails down on stretcher. Pt told to stop per [employer] officer, MD informed of patient's behavior.

## 2017-06-15 NOTE — ED Notes (Signed)
B[facility] called again, refusing to take patient without a 1:1 sitter. Environmental consultantCharge RN and house supervisor informed that patient is becoming more agitated and dangerous.

## 2017-06-15 NOTE — ED Notes (Signed)
Requested pt to sit on the mat on the floor and explained to pt that he was going to be given medication to help him calm down. Pt sat down on the floor mat and ripped off his shirt then allowed this RN and Matt, RN to give him injections of ordered medications.

## 2017-06-15 NOTE — ED Notes (Signed)
Report given to SOC MD.  

## 2017-06-15 NOTE — ED Notes (Signed)
BEHAVIORAL HEALTH ROUNDING  Patient sleeping: No.  Patient alert and oriented: yes  Behavior appropriate: no . ; If no, describe: see note Nutrition and fluids offered: Yes  Toileting and hygiene offered: Yes  Sitter present: not applicable, Q 15 min safety rounds and observation.  Law enforcement present: Yes ODS

## 2017-06-15 NOTE — ED Notes (Signed)
Pt in pushup position on stretcher trying to do pushups; advised pt to please lay flat due to it being unsafe

## 2017-06-15 NOTE — ED Notes (Signed)
EDP made aware of patient stating he hurt his right knee. There is no redness or swelling in the area. Patient does have dark purple areas to toes on left foot and dark purple areas to right great toe. Patient states he dont know how or what caused the dark areas. Patient is not complaining of pain at the time of assessment.

## 2017-06-16 MED ORDER — DIPHENHYDRAMINE HCL 25 MG PO CAPS
50.0000 mg | ORAL_CAPSULE | Freq: Once | ORAL | Status: AC
  Administered 2017-06-16: 50 mg via ORAL

## 2017-06-16 MED ORDER — DIPHENHYDRAMINE HCL 25 MG PO CAPS
ORAL_CAPSULE | ORAL | Status: AC
  Administered 2017-06-16: 50 mg via ORAL
  Filled 2017-06-16: qty 2

## 2017-06-16 NOTE — ED Provider Notes (Signed)
-----------------------------------------   1:25 PM on 06/16/2017 -----------------------------------------  I've seen and discussed the patient's transfer with the patient. He had no further questions at this time. Patient was standing brushing his teeth during our discussion.   Minna AntisPaduchowski, Tomas Schamp, MD 06/16/17 1325

## 2017-06-16 NOTE — ED Notes (Signed)
Patient is pleasant in conversation at this time, Patient with sitter at bedside, Patient is safe, q 15 minute checks and camera surveillance in progress.

## 2017-06-16 NOTE — ED Notes (Signed)
Patient is calm, sitter and security guard at bedside, patient without any behavioral issues, q 15 minute checks and camera surveillance in progress for safety.

## 2017-06-16 NOTE — ED Notes (Signed)
EDP made aware of latest events.

## 2017-06-16 NOTE — ED Notes (Signed)
Benadryl given per orders.
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
## 2017-06-16 NOTE — ED Notes (Signed)
Patient using the restroom and rested head against wall. Patient then would not move. Patient states, "you gave me haldol, you gave me haldol."  This writer explained to patient, I have not gave any haldol.  Patient would not walk to room, stating he was paralyzed. Sitter, ODS POS, and this Clinical research associatewriter placed patient in bed.

## 2017-06-16 NOTE — ED Notes (Signed)

## 2017-06-16 NOTE — ED Notes (Signed)
[facility], Patient compliant and cooperative, Dr. Len Blalockame to assess prior to transfer, and all forms transported with Patient with law enforcement.

## 2017-06-16 NOTE — ED Notes (Signed)
Patient ate crackers and peanut butter, beverage for breakfast, He is calm and cooperative at this time, friendly to staff, no behavioral issues at this time. Patient with sitter at bedside, will continue to monitor.
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
## 2017-06-16 NOTE — Progress Notes (Signed)
CH responded to request to visit patient that wanted to talk about Jesus and religion. CH spoke with patient briefly. Patient requested a bible. CH will try to provide material before patient is discharged this afternoon.

## 2017-06-16 NOTE — ED Notes (Signed)
[facility]'s belongings [facility]

## 2017-06-16 NOTE — ED Notes (Signed)
Pt. Alert and oriented, warm and dry, in no distress. Unable to asess Pt.  SI, HI, and AVH. Pt. Encouraged to let nursing staff know of any concerns or needs.
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
## 2017-06-16 NOTE — BH Assessment (Signed)
Patient has been accepted [facility].  Accepting physician is Dr. Shanda Bumpselores Brown.  Call report to [phone].  Representative was [employer].  ER Staff is aware of it Misty Stanley(Lisa, ER Sect.; Dr. Lenard LancePaduchowski, ER MD & Rudy JewWendy L., Patient's Nurse)

## 2018-03-19 ENCOUNTER — Other Ambulatory Visit: Payer: Self-pay

## 2018-03-24 ENCOUNTER — Ambulatory Visit: Payer: Medicare HMO | Admitting: Urology

## 2018-03-24 ENCOUNTER — Encounter: Payer: Self-pay | Admitting: Urology

## 2018-03-24 VITALS — BP 125/80 | HR 61 | Ht 73.0 in | Wt 224.8 lb

## 2018-03-24 DIAGNOSIS — R972 Elevated prostate specific antigen [PSA]: Secondary | ICD-10-CM | POA: Diagnosis not present

## 2018-03-24 LAB — URINALYSIS, COMPLETE
Bilirubin, UA: NEGATIVE
GLUCOSE, UA: NEGATIVE
Ketones, UA: NEGATIVE
LEUKOCYTES UA: NEGATIVE
Nitrite, UA: NEGATIVE
PH UA: 5 (ref 5.0–7.5)
PROTEIN UA: NEGATIVE
RBC, UA: NEGATIVE
Specific Gravity, UA: 1.02 (ref 1.005–1.030)
UUROB: 0.2 mg/dL (ref 0.2–1.0)

## 2018-03-24 LAB — MICROSCOPIC EXAMINATION
BACTERIA UA: NONE SEEN
Epithelial Cells (non renal): NONE SEEN /hpf (ref 0–10)

## 2018-03-24 NOTE — Progress Notes (Signed)
03/24/2018 3:29 PM   Randy Downs November 03, 1956 [id number]  Referring provider: Leanna Sato, MD [address] [place], [place] [address]  Chief Complaint  Patient presents with  . Elevated PSA    HPI: 62 year old male referred for further evaluation of elevated PSA up to 37.3.  He underwent routine annual PSA screening on 03/12/2018 at which time his PSA was noted to be 37.3.  He was previously within normal limits on 1.47 on 12/17/2016 when seen and evaluated by [facility] urology for spermatocele.  He was having urinary issues last week including urgency, frequency, dysuria, gross hematuria and was given abx by his primary care physician.  His symptoms started to improved after 5 days of antibiotics.  He is unsure which antibiotics were given but he completed him just a few days ago.  He has had near complete resolution of his urinary symptoms.  Associated urine culture by his primary physician was negative.  He did have one other episode of UTI/prostatitis in his early adulthood.  No history of recurrent urinary tract infections.  He does have baseline CKD, creatinine 1.55 on 2019.  He has had some weightloss from last year after the loss of his parents.   No bone pain.  PMH: Past Medical History:  Diagnosis Date  . Arthritis   . Bipolar 1 disorder (HCC)   . Hypertension   . Osteomyelitis (HCC)    L foot    Surgical History: Past Surgical History:  Procedure Laterality Date  . FOOT SURGERY Left   . HAND SURGERY Left   . HERNIA REPAIR      Home Medications:  Allergies as of 03/24/2018      Reactions   Quetiapine Rash   Non Andria Meuse Johnsson   Haldol [haloperidol Lactate]    Paralysis      Medication List        Accurate as of 03/24/18  3:29 PM. Always use your most recent med list.          acetaminophen 500 MG tablet Commonly known as:  TYLENOL Take 500 mg by mouth every 6 (six) hours as needed.   amLODipine 5 MG tablet Commonly known as:  NORVASC Take  5 mg by mouth daily.   ARIPiprazole 20 MG tablet Commonly known as:  ABILIFY Take 20 mg by mouth daily.   colchicine 0.6 MG tablet Take 0.6 mg by mouth as needed.   indomethacin 25 MG capsule Commonly known as:  INDOCIN Take 25 mg by mouth as needed.   KLOR-CON M20 20 MEQ tablet Generic drug:  potassium chloride SA Take 20 mEq by mouth daily.       Allergies:  Allergies  Allergen Reactions  . Quetiapine Rash    Non Bradly Chris  . Haldol [Haloperidol Lactate]     Paralysis     Family History: Family History  Problem Relation Age of Onset  . Heart disease Father   . Heart disease Mother     Social History:  reports that he has quit smoking. He has quit using smokeless tobacco. He reports that he does not drink alcohol or use drugs.  ROS: UROLOGY Frequent Urination?: Yes Hard to postpone urination?: No Burning/pain with urination?: No Get up at night to urinate?: Yes Leakage of urine?: No Urine stream starts and stops?: No Trouble starting stream?: No Do you have to strain to urinate?: No Blood in urine?: No Urinary tract infection?: No Sexually transmitted disease?: No Injury to kidneys or bladder?:  No Painful intercourse?: No Weak stream?: No Erection problems?: Yes Penile pain?: No  Gastrointestinal Nausea?: No Vomiting?: No Indigestion/heartburn?: No Diarrhea?: No Constipation?: No  Constitutional Fever: No Night sweats?: No Weight loss?: No Fatigue?: No  Skin Skin rash/lesions?: No Itching?: No  Eyes Blurred vision?: No Double vision?: No  Ears/Nose/Throat Sore throat?: No Sinus problems?: No  Hematologic/Lymphatic Swollen glands?: No Easy bruising?: No  Cardiovascular Leg swelling?: No Chest pain?: No  Respiratory Cough?: No Shortness of breath?: No  Endocrine Excessive thirst?: No  Musculoskeletal Back pain?: No Joint pain?: Yes  Neurological Headaches?: No Dizziness?: No  Psychologic Depression?:  No Anxiety?: No  Physical Exam: BP 125/80 (BP Location: Left Arm, Patient Position: Sitting, Cuff Size: Large)   Pulse 61   Ht  (1.854 m)   Wt 224 lb 12.8 oz (102 kg)   SpO2 99%   BMI 29.66 kg/m   Constitutional:  Alert and oriented, No acute distress. HEENT:  AT, moist mucus membranes.  Trachea midline, no masses. Cardiovascular: No clubbing, cyanosis, or edema. Respiratory: Normal respiratory effort, no increased work of breathing. GI: Abdomen is soft, nontender, nondistended, no abdominal masses GU: No CVA tenderness Rectal: Normal sphincter tone.  Enlarged 50+ cc prostate, rubbery, no nodules.  Nontender. Skin: No rashes, bruises or suspicious lesions. Neurologic: Grossly intact, no focal deficits, moving all 4 extremities. Psychiatric: Normal mood and affect.  Laboratory Data: Lab Results  Component Value Date   WBC 9.1 06/15/2017   HGB 14.0 06/15/2017   HCT 40.6 06/15/2017   MCV 82.5 06/15/2017   PLT 226 06/15/2017    Lab Results  Component Value Date   CREATININE 1.43 (H) 06/15/2017    Urinalysis    Component Value Date/Time   COLORURINE Yellow 02/08/2013 0520   APPEARANCEUR Clear 03/24/2018 1021   LABSPEC 1.021 02/08/2013 0520   PHURINE 5.0 02/08/2013 0520   GLUCOSEU Negative 03/24/2018 1021   GLUCOSEU Negative 02/08/2013 0520   HGBUR Negative 02/08/2013 0520   BILIRUBINUR Negative 03/24/2018 1021   BILIRUBINUR Negative 02/08/2013 0520   KETONESUR 2+ 02/08/2013 0520   PROTEINUR Negative 03/24/2018 1021   PROTEINUR 30 mg/dL 16/08/9603 5409   NITRITE Negative 03/24/2018 1021   NITRITE Negative 02/08/2013 0520   LEUKOCYTESUR Negative 03/24/2018 1021   LEUKOCYTESUR Negative 02/08/2013 0520    Lab Results  Component Value Date   LABMICR Downs below: 03/24/2018   WBCUA 0-5 03/24/2018   RBCUA 0-2 03/24/2018   LABEPIT None seen 03/24/2018   BACTERIA None seen 03/24/2018    Pertinent Imaging: N/a  Assessment & Plan:    1. Elevated  PSA 62 year old referred for markedly elevated PSA to 37.3.  Based on his history, it does seem like he was having a bout of acute prostatitis which has resolved appropriately with antibiotics at the time of PSA screening.    Previous history of normal PSA just over a year ago at [facility] and unremarkable rectal exam today is reassuring.  Given that his urinary symptoms have just now recently resolved, would like to wait another week and then repeat his PSA.  We will determine his follow-up based on these results.  He understands the importance of coming back for lab draw.  He understands the differential diagnosis for elevated PSA including prostate cancer.  All questions were answered.  We will arrange for lab visit for PSA next week, will call him with results and follow-up plan based on this.  - Urinalysis, Complete  Return for PSA next  week.  Vanna Scotland, MD  [facility] [address] [place], [place] [address] [phone]

## 2018-03-31 ENCOUNTER — Other Ambulatory Visit: Payer: Self-pay | Admitting: Family Medicine

## 2018-03-31 DIAGNOSIS — R972 Elevated prostate specific antigen [PSA]: Secondary | ICD-10-CM

## 2018-04-02 ENCOUNTER — Other Ambulatory Visit: Payer: Medicare HMO

## 2018-04-02 DIAGNOSIS — R972 Elevated prostate specific antigen [PSA]: Secondary | ICD-10-CM

## 2018-04-03 ENCOUNTER — Telehealth: Payer: Self-pay

## 2018-04-03 DIAGNOSIS — R972 Elevated prostate specific antigen [PSA]: Secondary | ICD-10-CM

## 2018-04-03 LAB — PSA: Prostate Specific Ag, Serum: 10.5 ng/mL — ABNORMAL HIGH (ref 0.0–4.0)

## 2018-04-03 NOTE — Telephone Encounter (Signed)
Pt informed, please call and schedule lab appointment. I have placed the lab order.

## 2018-04-03 NOTE — Telephone Encounter (Signed)
LMOM with appointment information. Appt reminder mailed to pt as well.

## 2018-04-03 NOTE — Telephone Encounter (Signed)
-----   Message from Vanna Scotland, MD sent at 04/03/2018  8:01 AM EDT ----- Your PSA is coming back down nicely and quickly.  It is down to 10.5.  This is still elevated from your baseline, but I suspect you still have some inflammation from your recent episode of prostatitis.  I would like to recheck this in 2 months.  Please make a lab visit to arrange this.  Vanna Scotland, MD

## 2018-04-16 IMAGING — CT CT CHEST W/O CM
2 of 3 series · 17 of 46 positions shown, 19 images · non-contrast
Comparison: Chest radiograph performed 04/04/2016

CLINICAL DATA: Acute onset of cough, shortness of breath and
generalized body aches. Initial encounter.

EXAM:
CT CHEST WITHOUT CONTRAST
TECHNIQUE: Multidetector CT imaging of the chest was performed following the
standard protocol without IV contrast.

[Series 2: routine chest wo · axial · 0.84mm/px · z∈[-624,-304]mm · 14 of 74 slices shown, 16 images]
[im 5/74  soft-tissue]
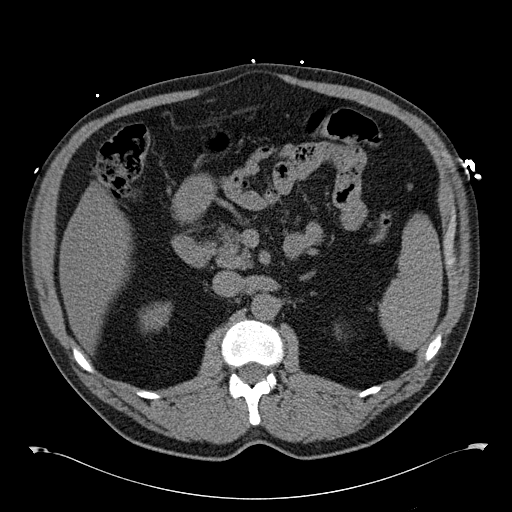
[im 5/74  bone]
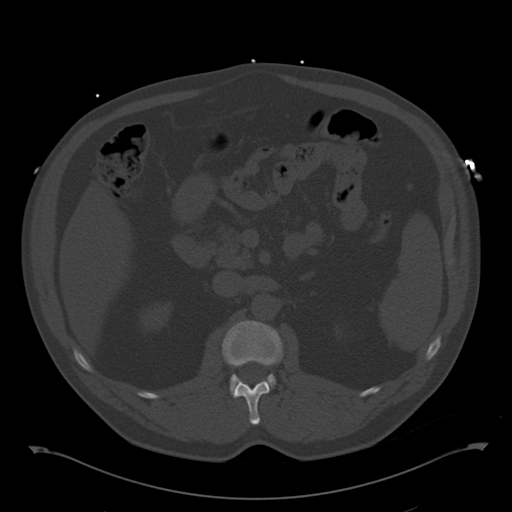
[im 10/74  soft-tissue]
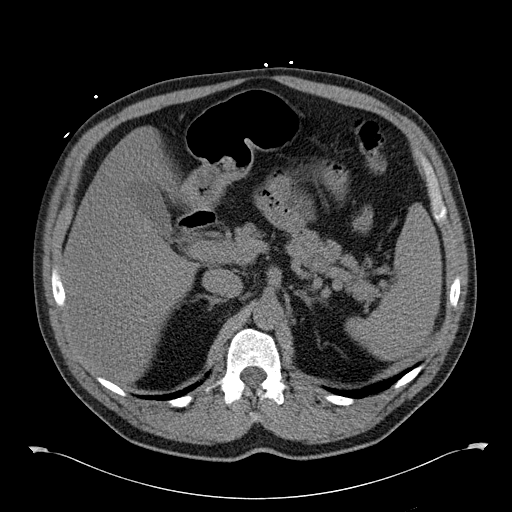
[im 15/74  soft-tissue]
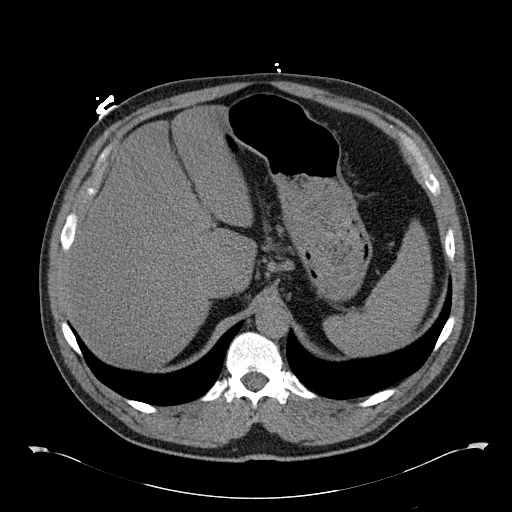
[im 19/74  soft-tissue]
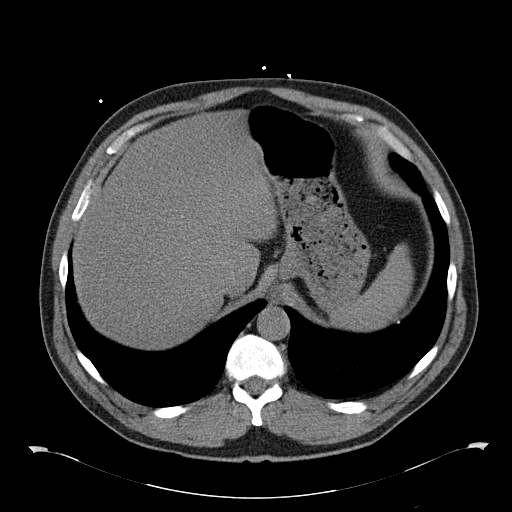
[im 24/74  soft-tissue]
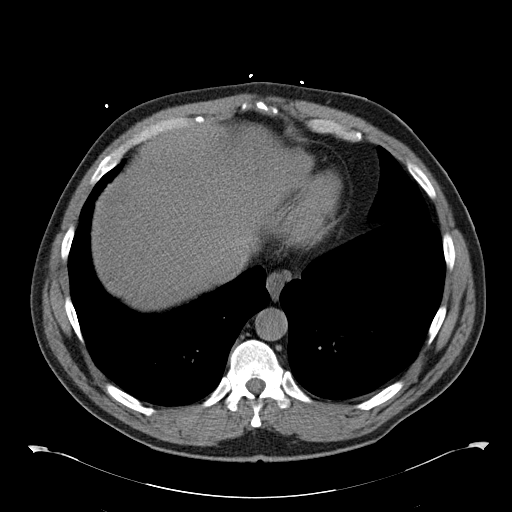
[im 29/74  soft-tissue]
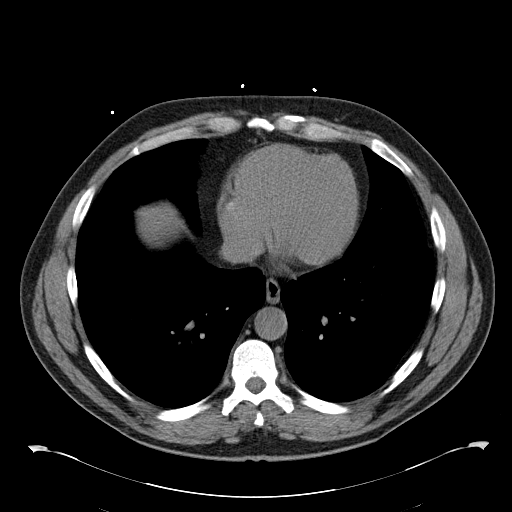
[im 33/74  soft-tissue]
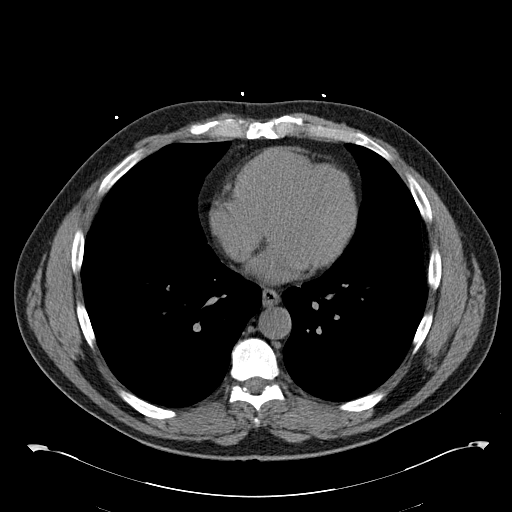
[im 41/74  soft-tissue]
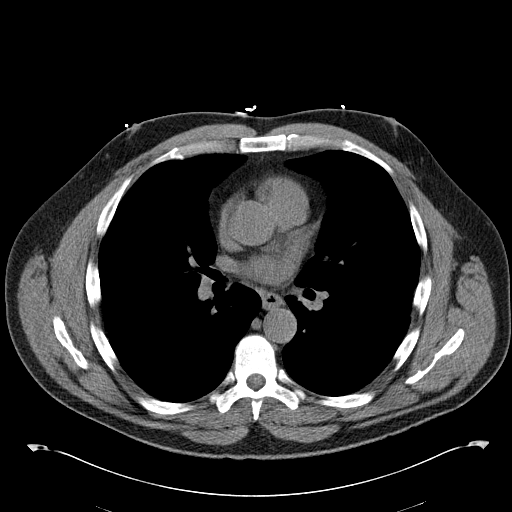
[im 45/74  soft-tissue]
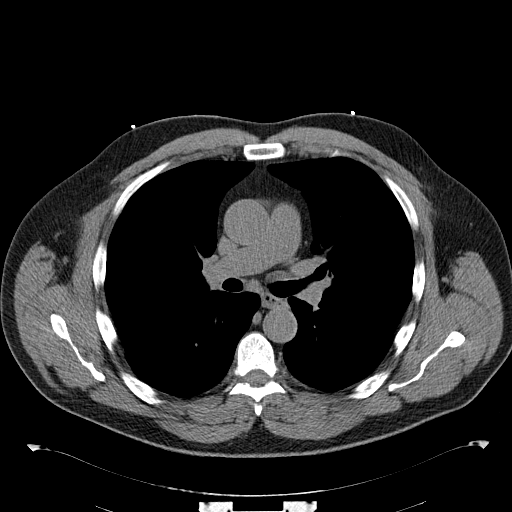
[im 45/74  bone]
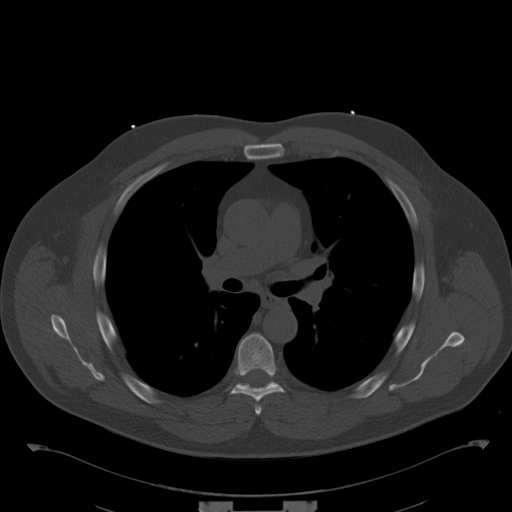
[im 50/74  soft-tissue]
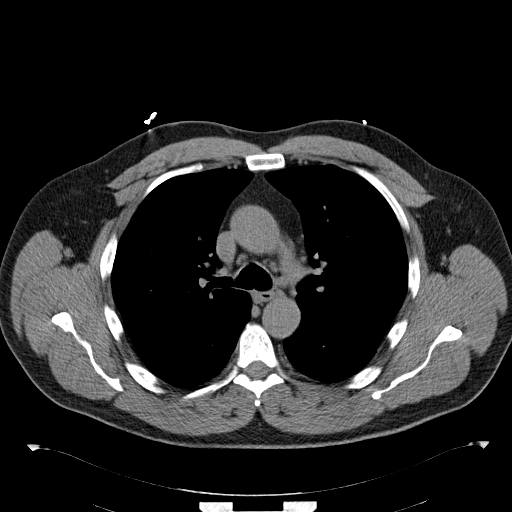
[im 55/74  soft-tissue]
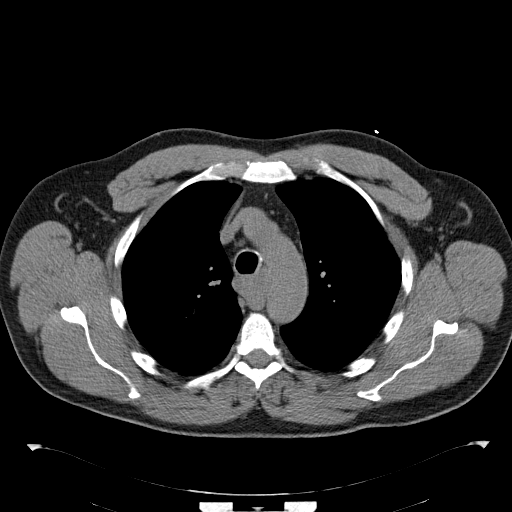
[im 59/74  soft-tissue]
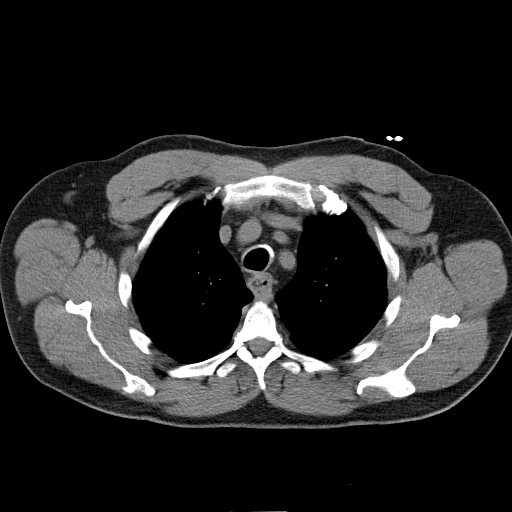
[im 64/74  soft-tissue]
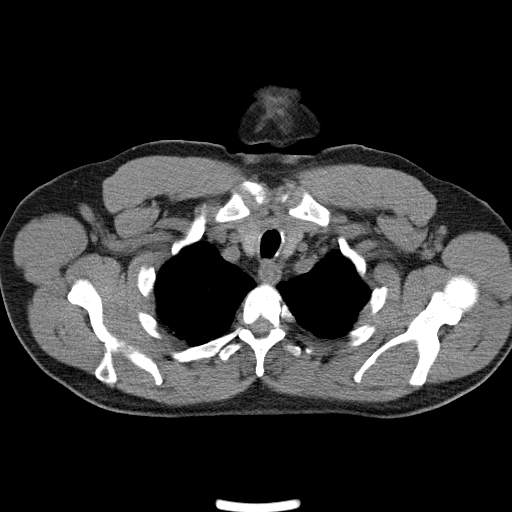
[im 69/74  soft-tissue]
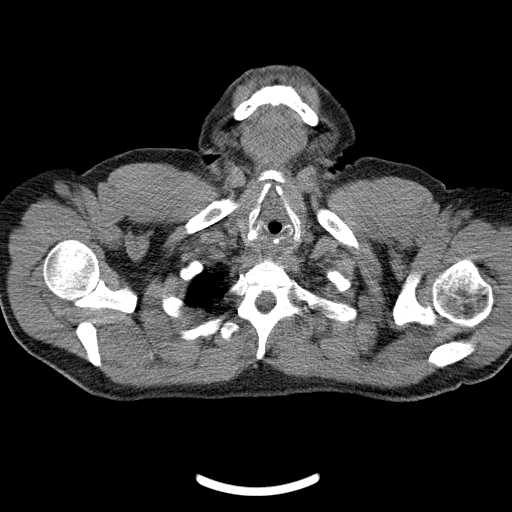

[Series 5: routine chest wo cor · coronal · 0.80mm/px · 3 of 155 slices shown]
[im 52/155  soft-tissue]
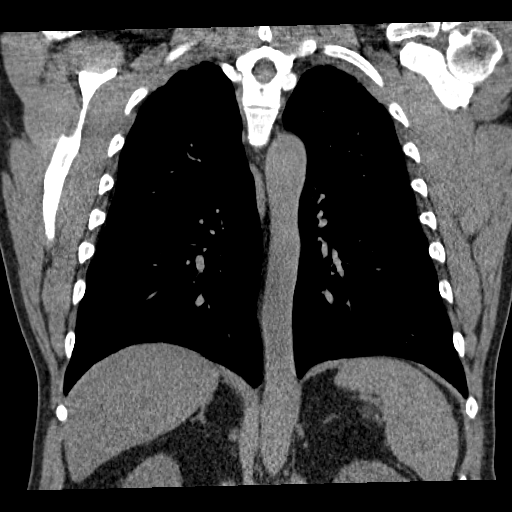
[im 69/155  soft-tissue]
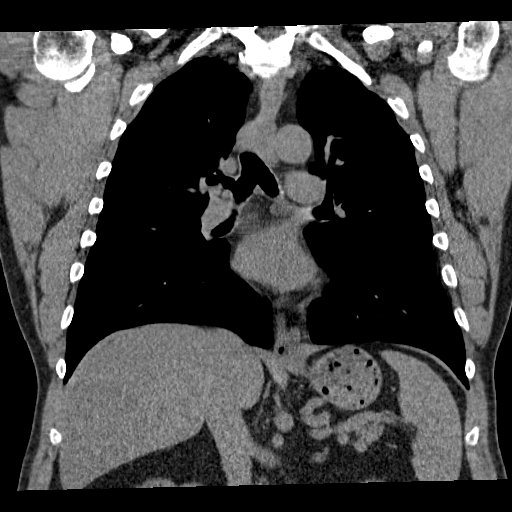
[im 86/155  soft-tissue]
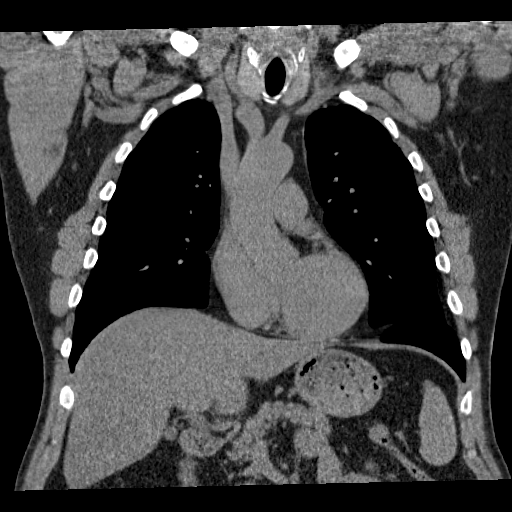

[17 of 46 positions shown; findings below may reference images not displayed]

FINDINGS: The lungs appear essentially clear bilaterally. No focal
consolidation, pleural effusion or pneumothorax is seen. No masses
are identified.

The mediastinum is unremarkable in appearance. No mediastinal
lymphadenopathy is seen. No pericardial effusion is identified. The
great vessels are grossly unremarkable in appearance.

There is question of focal distention of the proximal to mid
esophagus just above the level of the carina. This may be transient
in nature, though an underlying mass cannot be entirely excluded.

The thyroid gland is unremarkable in appearance. No axillary
lymphadenopathy is seen.

The visualized portions of the liver and spleen are unremarkable in
appearance. The gallbladder is unremarkable. The visualized portions
of the pancreas adrenal glands are within normal limits.

No acute osseous abnormalities are identified.
IMPRESSION: 1. Question of focal distention of the proximal to mid esophagus
just above the level of the carina. This may be transient in nature,
though an underlying mass cannot be entirely excluded. Would
correlate with the patient's symptoms, and consider endoscopy for
further evaluation.
2. Otherwise unremarkable noncontrast CT of the chest.

## 2018-06-03 ENCOUNTER — Other Ambulatory Visit: Payer: Medicare HMO

## 2018-06-03 DIAGNOSIS — R972 Elevated prostate specific antigen [PSA]: Secondary | ICD-10-CM

## 2018-06-04 ENCOUNTER — Telehealth: Payer: Self-pay | Admitting: Family Medicine

## 2018-06-04 LAB — PSA: PROSTATE SPECIFIC AG, SERUM: 3.9 ng/mL (ref 0.0–4.0)

## 2018-06-04 NOTE — Telephone Encounter (Signed)
Unable to reach, the phone just stops ringing

## 2018-06-04 NOTE — Telephone Encounter (Signed)
-----   Message from Vanna ScotlandAshley Brandon, MD sent at 06/04/2018  7:48 AM EDT ----- PSA has come down to a very appropriate level!  Great news!  Recommend follow up as needed.  Vanna ScotlandAshley Brandon, MD

## 2018-06-04 NOTE — Telephone Encounter (Signed)
Pt informed

## 2018-10-29 ENCOUNTER — Emergency Department: Payer: Medicare HMO

## 2018-10-29 ENCOUNTER — Encounter: Payer: Self-pay | Admitting: Emergency Medicine

## 2018-10-29 ENCOUNTER — Other Ambulatory Visit: Payer: Self-pay

## 2018-10-29 ENCOUNTER — Emergency Department
Admission: EM | Admit: 2018-10-29 | Discharge: 2018-10-29 | Disposition: A | Discharge status: Home / Self Care | Payer: Medicare HMO | Attending: Emergency Medicine | Admitting: Emergency Medicine

## 2018-10-29 DIAGNOSIS — Z79899 Other long term (current) drug therapy: Secondary | ICD-10-CM | POA: Insufficient documentation

## 2018-10-29 DIAGNOSIS — Y929 Unspecified place or not applicable: Secondary | ICD-10-CM | POA: Insufficient documentation

## 2018-10-29 DIAGNOSIS — S61012A Laceration without foreign body of left thumb without damage to nail, initial encounter: Secondary | ICD-10-CM | POA: Insufficient documentation

## 2018-10-29 DIAGNOSIS — Y998 Other external cause status: Secondary | ICD-10-CM | POA: Insufficient documentation

## 2018-10-29 DIAGNOSIS — S62522B Displaced fracture of distal phalanx of left thumb, initial encounter for open fracture: Secondary | ICD-10-CM | POA: Diagnosis not present

## 2018-10-29 DIAGNOSIS — W312XXA Contact with powered woodworking and forming machines, initial encounter: Secondary | ICD-10-CM | POA: Insufficient documentation

## 2018-10-29 DIAGNOSIS — I10 Essential (primary) hypertension: Secondary | ICD-10-CM | POA: Diagnosis not present

## 2018-10-29 DIAGNOSIS — Y93H3 Activity, building and construction: Secondary | ICD-10-CM | POA: Diagnosis not present

## 2018-10-29 DIAGNOSIS — Z23 Encounter for immunization: Secondary | ICD-10-CM | POA: Diagnosis not present

## 2018-10-29 DIAGNOSIS — Z87891 Personal history of nicotine dependence: Secondary | ICD-10-CM | POA: Diagnosis not present

## 2018-10-29 DIAGNOSIS — S6992XA Unspecified injury of left wrist, hand and finger(s), initial encounter: Secondary | ICD-10-CM | POA: Diagnosis present

## 2018-10-29 MED ORDER — ONDANSETRON 8 MG PO TBDP
8.0000 mg | ORAL_TABLET | Freq: Once | ORAL | Status: AC
  Administered 2018-10-29: 8 mg via ORAL
  Filled 2018-10-29: qty 1

## 2018-10-29 MED ORDER — CEPHALEXIN 500 MG PO CAPS: 500.0000 mg | ORAL_CAPSULE | Freq: Four times a day (QID) | ORAL | 0 refills | Status: DC

## 2018-10-29 MED ORDER — CEPHALEXIN 500 MG PO CAPS
500.0000 mg | ORAL_CAPSULE | Freq: Four times a day (QID) | ORAL | 0 refills | Status: AC
Start: 2018-10-29 — End: 2018-11-08

## 2018-10-29 MED ORDER — SULFAMETHOXAZOLE-TRIMETHOPRIM 800-160 MG PO TABS
1.0000 | ORAL_TABLET | Freq: Once | ORAL | Status: AC
  Administered 2018-10-29: 1 via ORAL

## 2018-10-29 MED ORDER — CEPHALEXIN 500 MG PO CAPS: 500.0000 mg | ORAL_CAPSULE | Freq: Once | ORAL | Status: DC

## 2018-10-29 MED ORDER — OXYCODONE-ACETAMINOPHEN 7.5-325 MG PO TABS: 1.0000 | ORAL_TABLET | Freq: Four times a day (QID) | ORAL | 0 refills | Status: DC | PRN

## 2018-10-29 MED ORDER — SULFAMETHOXAZOLE-TRIMETHOPRIM 800-160 MG PO TABS
1.0000 | ORAL_TABLET | Freq: Once | ORAL | Status: DC
  Filled 2018-10-29: qty 1

## 2018-10-29 MED ORDER — SULFAMETHOXAZOLE-TRIMETHOPRIM 800-160 MG PO TABS: 1.0000 | ORAL_TABLET | Freq: Two times a day (BID) | ORAL | 0 refills | Status: AC

## 2018-10-29 MED ORDER — BACITRACIN-NEOMYCIN-POLYMYXIN 400-5-5000 EX OINT
TOPICAL_OINTMENT | Freq: Once | CUTANEOUS | Status: AC
  Administered 2018-10-29: 13:00:00 via TOPICAL
  Filled 2018-10-29: qty 1

## 2018-10-29 MED ORDER — LIDOCAINE HCL (PF) 1 % IJ SOLN
5.0000 mL | Freq: Once | INTRAMUSCULAR | Status: AC
  Administered 2018-10-29 (×2): 5 mL

## 2018-10-29 MED ORDER — LIDOCAINE HCL (PF) 1 % IJ SOLN: 5.0000 mL | Freq: Once | INTRAMUSCULAR | Status: DC

## 2018-10-29 MED ORDER — LIDOCAINE HCL (PF) 1 % IJ SOLN
INTRAMUSCULAR | Status: AC
  Administered 2018-10-29: 5 mL
  Filled 2018-10-29: qty 5

## 2018-10-29 MED ORDER — SULFAMETHOXAZOLE-TRIMETHOPRIM 800-160 MG PO TABS: 1.0000 | ORAL_TABLET | Freq: Two times a day (BID) | ORAL | 0 refills | Status: DC

## 2018-10-29 MED ORDER — CEPHALEXIN 500 MG PO CAPS
500.0000 mg | ORAL_CAPSULE | Freq: Once | ORAL | Status: AC
  Administered 2018-10-29: 500 mg via ORAL
  Filled 2018-10-29: qty 1

## 2018-10-29 MED ORDER — TETANUS-DIPHTH-ACELL PERTUSSIS 5-2.5-18.5 LF-MCG/0.5 IM SUSP
0.5000 mL | Freq: Once | INTRAMUSCULAR | Status: AC
  Administered 2018-10-29: 0.5 mL via INTRAMUSCULAR
  Filled 2018-10-29: qty 0.5

## 2018-10-29 MED ORDER — OXYCODONE-ACETAMINOPHEN 7.5-325 MG PO TABS: 1.0000 | ORAL_TABLET | Freq: Four times a day (QID) | ORAL | 0 refills | Status: AC | PRN

## 2018-10-29 NOTE — ED Triage Notes (Signed)
Pt arrived via POV with reports of laceration to left thumb this morning on circulating saw.  Unknown when last tetanus shot was.

## 2018-10-29 NOTE — ED Provider Notes (Signed)
[facility] Emergency Department Provider Note   ____________________________________________   First MD Initiated Contact with Patient 10/29/18 1033     (approximate)  I have reviewed the triage vital signs and the nursing notes.   HISTORY  Chief Complaint Laceration (left thumb)    HPI Randy Downs is a 62 y.o. male patient presents with pain and bleeding to the left thumb secondary to a circular saw cut prior to arrival.  Patient did bleed is controlled with direct pressure.  Patient denies loss of sensation or loss of function.  Patient is right-hand dominant.  Patient tetanus shot is not up-to-date.  Patient rates the pain as a 10/10.  Patient described pain is "achy".   Past Medical History:  Diagnosis Date  . Arthritis   . Bipolar 1 disorder (HCC)   . Hypertension   . Osteomyelitis (HCC)    L foot    Patient Active Problem List   Diagnosis Date Noted  . Inguinal hernia 01/22/2017  . Atypical chest pain 01/01/2017  . Essential hypertension 01/01/2017  . High risk medication use 04/14/2013  . Gout with manifestations 03/23/2013  . Bipolar affective disorder, depressed, severe, with psychotic behavior (HCC) 02/26/2013    Past Surgical History:  Procedure Laterality Date  . FOOT SURGERY Left   . HAND SURGERY Left   . HERNIA REPAIR      Prior to Admission medications   Medication Sig Start Date End Date Taking? Authorizing Provider  acetaminophen (TYLENOL) 500 MG tablet Take 500 mg by mouth every 6 (six) hours as needed.    [provider]  amLODipine (NORVASC) 5 MG tablet Take 5 mg by mouth daily. 05/02/17   [provider]  ARIPiprazole (ABILIFY) 20 MG tablet Take 20 mg by mouth daily. 04/24/17   [provider]  cephALEXin (KEFLEX) 500 MG capsule Take 1 capsule (500 mg total) by mouth 4 (four) times daily for 10 days. 10/29/18 11/08/18  Joni ReiningSmith, Ronald K, PA-C  colchicine 0.6 MG tablet Take 0.6 mg by mouth as  needed.  04/14/13   [provider]  indomethacin (INDOCIN) 25 MG capsule Take 25 mg by mouth as needed.  03/23/18   [provider]  KLOR-CON M20 20 MEQ tablet Take 20 mEq by mouth daily.  03/19/18   [provider]  oxyCODONE-acetaminophen (PERCOCET) 7.5-325 MG tablet Take 1 tablet by mouth every 6 (six) hours as needed. 10/29/18   Joni ReiningSmith, Ronald K, PA-C  sulfamethoxazole-trimethoprim (BACTRIM DS,SEPTRA DS) 800-160 MG tablet Take 1 tablet by mouth 2 (two) times daily. 10/29/18   Joni ReiningSmith, Ronald K, PA-C    Allergies Quetiapine and Haldol [haloperidol lactate]  Family History  Problem Relation Age of Onset  . Heart disease Father   . Heart disease Mother     Social History Social History   Tobacco Use  . Smoking status: Former Games developermoker  . Smokeless tobacco: Former Engineer, waterUser  Substance Use Topics  . Alcohol use: No  . Drug use: No    Review of Systems  Constitutional: No fever/chills Eyes: No visual changes. ENT: No sore throat. Cardiovascular: Denies chest pain. Respiratory: Denies shortness of breath. Gastrointestinal: No abdominal pain.  No nausea, no vomiting.  No diarrhea.  No constipation. Genitourinary: Negative for dysuria. Musculoskeletal: Negative for back pain. Skin: Negative for rash. Neurological: Negative for headaches, focal weakness or numbness. Psychiatric:Bipolar Endocrine:Hypertension Allergic/Immunilogical: See medication list ____________________________________________   PHYSICAL EXAM:  VITAL SIGNS: ED Triage Vitals  Enc Vitals Group  BP 10/29/18 1020 (!) 143/95     Pulse Rate 10/29/18 1020 74     Resp 10/29/18 1020 18     Temp 10/29/18 1020 97.8 F (36.6 C)     Temp Source 10/29/18 1020 Oral     SpO2 10/29/18 1020 97 %     Weight 10/29/18 1018 225 lb (102.1 kg)     Height 10/29/18 1018 6\' 1"  (1.854 m)     Head Circumference --      Peak Flow --      Pain Score 10/29/18 1018 10     Pain Loc --      Pain Edu? --       Excl. in GC? --    Constitutional: Alert and oriented. Well appearing and in no acute distress. Cardiovascular: Normal rate, regular rhythm. Grossly normal heart sounds.  Good peripheral circulation. Respiratory: Normal respiratory effort.  No retractions. Lungs CTAB. Neurologic:  Normal speech and language. No gross focal neurologic deficits are appreciated. No gait instability. Skin:  Skin is warm, dry and intact. No rash noted.  Laceration dorsal aspect of left thumb. Psychiatric: Mood and affect are normal. Speech and behavior are normal.  ____________________________________________   LABS (all labs ordered are listed, but only abnormal results are displayed)  Labs Reviewed - No data to display ____________________________________________  EKG   ____________________________________________  RADIOLOGY  ED MD interpretation:    Official radiology report(s): Dg Finger Thumb Left  Result Date: 10/29/2018 CLINICAL DATA:  Cut thumb with circular saw EXAM: LEFT THUMB 2+V COMPARISON:  There is fracture of the tuft of the distal phalanx of the left thumb with several small bone fragments. FINDINGS: There is transverse fracture through the base of the tuft of the distal phalanx of the left thumb with several small associated bone fragments adjacent. There is slight distraction of the distal tuft. No other acute bony abnormality is seen. IMPRESSION: Transverse fracture of the base of the tuft of the distal phalanx of the left thumb with several small adjacent bone fragments. Slight distraction. Electronically Signed   By: Dwyane Dee M.D.   On: 10/29/2018 11:37    ____________________________________________   PROCEDURES  Procedure(s) performed: None  .Splint Application Date/Time: 10/29/2018 12:27 PM Performed by: Marguerita Merles, NT Authorized by: Joni Reining, PA-C   Consent:    Consent obtained:  Verbal   Consent given by:  Patient   Risks discussed:  Numbness, pain  and swelling Pre-procedure details:    Sensation:  Normal Procedure details:    Laterality:  Left   Location:  Finger   Finger:  L thumb   Supplies:  Prefabricated splint Post-procedure details:    Pain:  Unchanged   Sensation:  Normal   Patient tolerance of procedure:  Tolerated well, no immediate complications  .Marland KitchenLaceration Repair Date/Time: 10/29/2018 12:44 PM Performed by: Marguerita Merles, NT Authorized by: Joni Reining, PA-C   Consent:    Consent obtained:  Verbal   Consent given by:  Patient   Risks discussed:  Infection, pain, poor cosmetic result, need for additional repair, vascular damage, poor wound healing and nerve damage Anesthesia (see MAR for exact dosages):    Anesthesia method:  Nerve block   Block needle gauge:  25 G   Block anesthetic:  Lidocaine 1% w/o epi   Block injection procedure:  Anatomic landmarks identified and incremental injection   Block outcome:  Anesthesia achieved Laceration details:    Location:  Finger  Finger location:  L thumb   Length (cm):  1 Repair type:    Repair type:  Complex Pre-procedure details:    Preparation:  Patient was prepped and draped in usual sterile fashion Exploration:    Hemostasis achieved with:  Direct pressure   Wound exploration: wound explored through full range of motion and entire depth of wound probed and visualized   Treatment:    Area cleansed with:  Betadine and saline   Amount of cleaning:  Extensive   Irrigation solution:  Sterile saline   Irrigation volume:  500cc   Irrigation method:  Syringe   Visualized foreign bodies/material removed: yes     Debridement:  Minimal   Undermining:  None   Scar revision: no   Skin repair:    Repair method:  Sutures   Suture size:  3-0   Suture material:  Nylon   Suture technique:  Simple interrupted   Number of sutures:  6 Approximation:    Approximation:  Close Post-procedure details:    Dressing:  Antibiotic ointment, non-adherent dressing and  sterile dressing   Patient tolerance of procedure:  Tolerated well, no immediate complications    Critical Care performed: No  ____________________________________________   INITIAL IMPRESSION / ASSESSMENT AND PLAN / ED COURSE  As part of my medical decision making, I reviewed the following data within the electronic MEDICAL RECORD NUMBER    Patient presents for laceration to the left thumb.  Patient using a circular saw which cut to the bone at the distal phalanx left thumb.  Discussed x-ray findings with supervising Dr. and on-call orthopedics.  Extensive irrigation and wound was approximated.  Patient placed in a thumb spica splint.  Patient given discharge care instructions.  Patient advised to take medication as directed and follow-up with orthopedics in 4 days.  Return right ED if condition worsens.      ____________________________________________   FINAL CLINICAL IMPRESSION(S) / ED DIAGNOSES  Final diagnoses:  Open fracture of base of distal phalanx of left thumb     ED Discharge Orders         Ordered    sulfamethoxazole-trimethoprim (BACTRIM DS,SEPTRA DS) 800-160 MG tablet  2 times daily,   Status:  Discontinued     10/29/18 1231    cephALEXin (KEFLEX) 500 MG capsule  4 times daily,   Status:  Discontinued     10/29/18 1231    oxyCODONE-acetaminophen (PERCOCET) 7.5-325 MG tablet  Every 6 hours PRN,   Status:  Discontinued     10/29/18 1231    cephALEXin (KEFLEX) 500 MG capsule  4 times daily     10/29/18 1244    oxyCODONE-acetaminophen (PERCOCET) 7.5-325 MG tablet  Every 6 hours PRN     10/29/18 1244    sulfamethoxazole-trimethoprim (BACTRIM DS,SEPTRA DS) 800-160 MG tablet  2 times daily     10/29/18 1244           Note:  This document was prepared using Dragon voice recognition software and may include unintentional dictation errors.    Joni ReiningSmith, Ronald K, PA-C 10/29/18 1249    Jene EveryKinner, Robert, MD 10/29/18 [phone]

## 2018-10-29 NOTE — ED Notes (Signed)
See triage note   Laceration to left thumb from circular saw  Provider in room on arrival

## 2018-10-29 NOTE — Discharge Instructions (Signed)
Follow discharge care instruction wear splint until evaluation by orthopedics.  Call Monday morning at 830 and tell them you follow-up in the emergency room.  They will take your time to come in for definitive evaluation and treatment.  Be advised pain medication may cause drowsiness.  Do not operate machinery or drive while taking pain medication.

## 2020-11-08 IMAGING — DX DG FINGER THUMB 2+V*L*
3 series · 3 of 3 positions shown · non-contrast
Comparison: There is fracture of the tuft of the distal phalanx of
the left thumb with several small bone fragments.

CLINICAL DATA: Cut thumb with circular saw

EXAM:
LEFT THUMB 2+V

[finger ap]
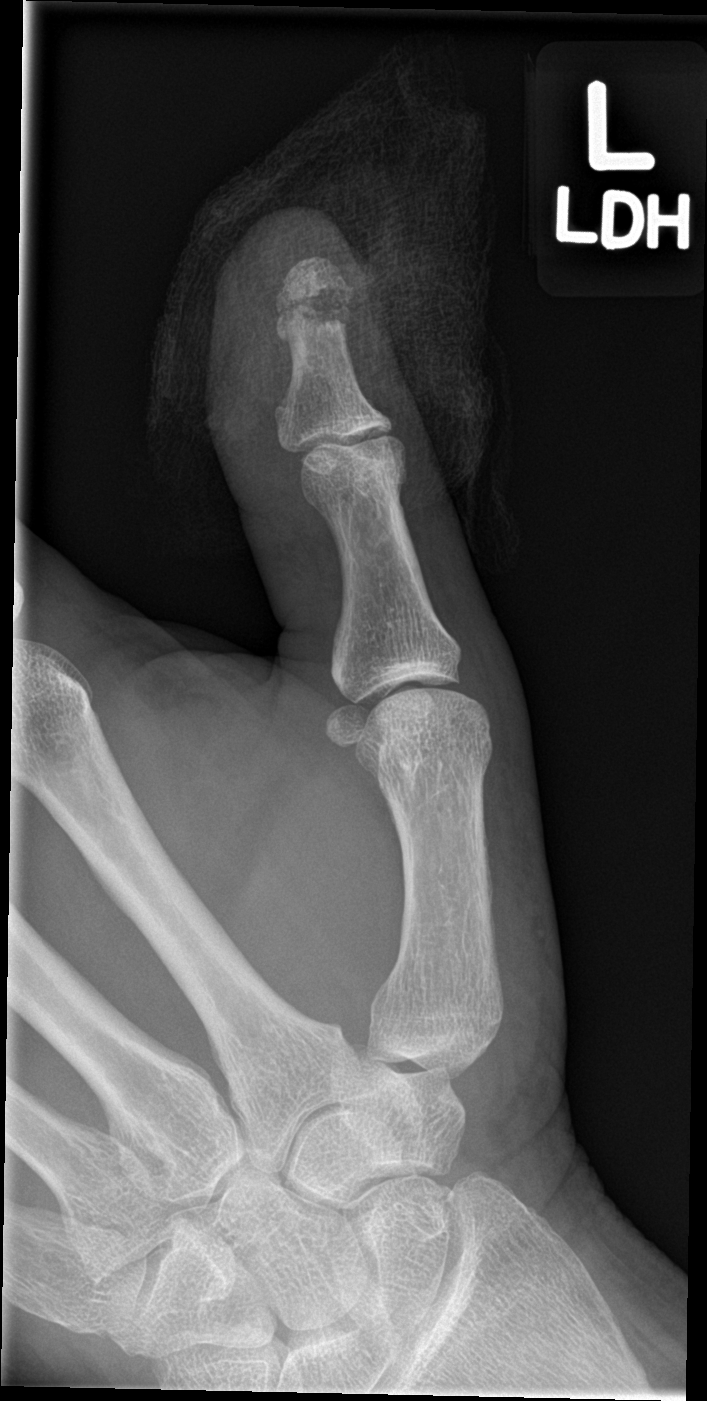

[finger lat]
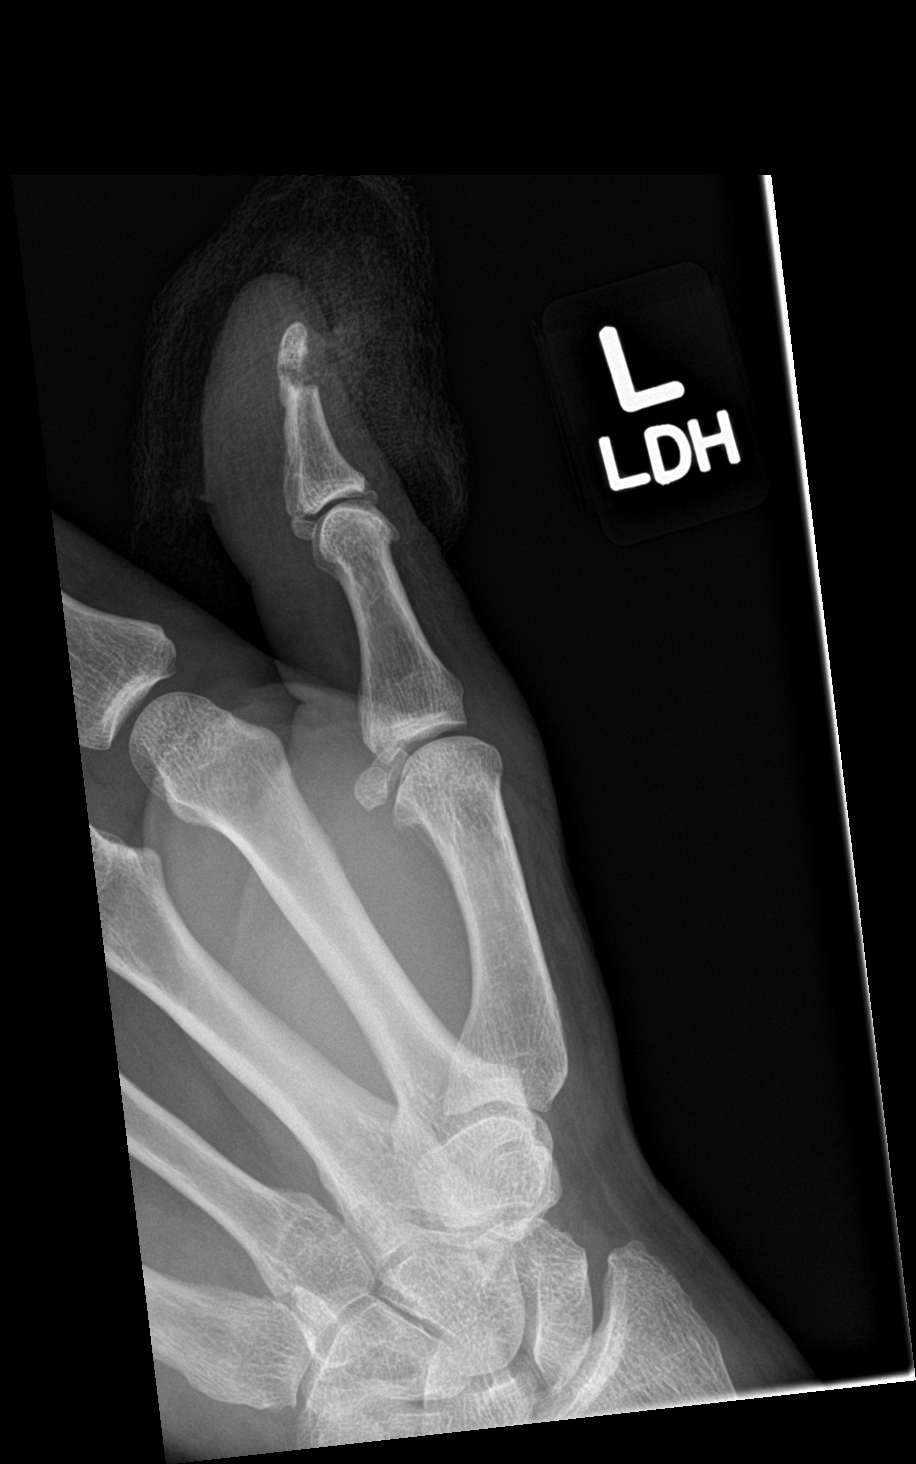

[finger obl]
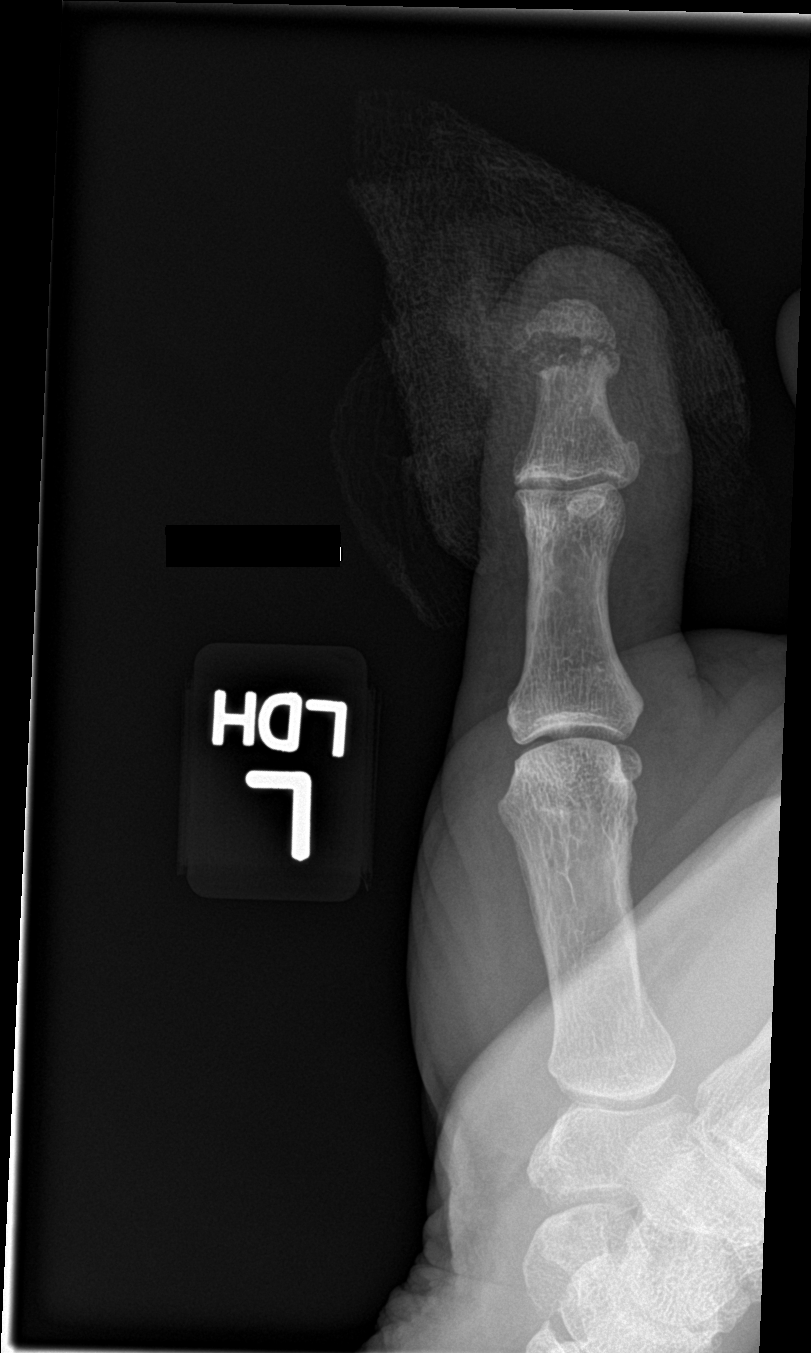

[3 of 3 positions shown; findings below may reference images not displayed]

FINDINGS: There is transverse fracture through the base of the tuft of the
distal phalanx of the left thumb with several small associated bone
fragments adjacent. There is slight distraction of the distal tuft.
No other acute bony abnormality is seen.
IMPRESSION: Transverse fracture of the base of the tuft of the distal phalanx of
the left thumb with several small adjacent bone fragments. Slight
distraction.
